# Patient Record
Sex: Female | Born: 1960
Health system: Southern US, Community
[De-identification: ages and names within clinical notes are randomized; demographics above are authoritative.]

## PROBLEM LIST (undated history)

## (undated) DIAGNOSIS — E039 Hypothyroidism, unspecified: Secondary | ICD-10-CM

## (undated) DIAGNOSIS — F419 Anxiety disorder, unspecified: Secondary | ICD-10-CM

## (undated) DIAGNOSIS — Z8659 Personal history of other mental and behavioral disorders: Secondary | ICD-10-CM

## (undated) DIAGNOSIS — I341 Nonrheumatic mitral (valve) prolapse: Secondary | ICD-10-CM

## (undated) DIAGNOSIS — K219 Gastro-esophageal reflux disease without esophagitis: Secondary | ICD-10-CM

## (undated) DIAGNOSIS — R310 Gross hematuria: Secondary | ICD-10-CM

## (undated) DIAGNOSIS — R5383 Other fatigue: Secondary | ICD-10-CM

## (undated) DIAGNOSIS — T7840XA Allergy, unspecified, initial encounter: Secondary | ICD-10-CM

## (undated) DIAGNOSIS — E538 Deficiency of other specified B group vitamins: Secondary | ICD-10-CM

## (undated) DIAGNOSIS — M1990 Unspecified osteoarthritis, unspecified site: Secondary | ICD-10-CM

## (undated) HISTORY — DX: Deficiency of other specified B group vitamins: E53.8

## (undated) HISTORY — DX: Personal history of other mental and behavioral disorders: Z86.59

## (undated) HISTORY — DX: Anxiety disorder, unspecified: F41.9

## (undated) HISTORY — PX: APPENDECTOMY: SHX54

## (undated) HISTORY — PX: BREAST LUMPECTOMY: SHX2

## (undated) HISTORY — DX: Allergy, unspecified, initial encounter: T78.40XA

## (undated) HISTORY — PX: BREAST BIOPSY: SHX20

## (undated) HISTORY — DX: Unspecified osteoarthritis, unspecified site: M19.90

## (undated) HISTORY — PX: ABDOMINAL HYSTERECTOMY: SHX81

## (undated) HISTORY — DX: Other fatigue: R53.83

## (undated) HISTORY — DX: Hypothyroidism, unspecified: E03.9

## (undated) HISTORY — PX: HAND SURGERY: SHX662

## (undated) HISTORY — DX: Gastro-esophageal reflux disease without esophagitis: K21.9

## (undated) HISTORY — DX: Nonrheumatic mitral (valve) prolapse: I34.1

## (undated) HISTORY — DX: Gross hematuria: R31.0

## (undated) HISTORY — PX: POLYPECTOMY: SHX149

---

## 1998-01-01 ENCOUNTER — Inpatient Hospital Stay (HOSPITAL_COMMUNITY): Admission: RE | Admit: 1998-01-01 | Discharge: 1998-01-03 | Payer: Self-pay | Admitting: Gynecology

## 2001-04-26 ENCOUNTER — Other Ambulatory Visit: Admission: RE | Admit: 2001-04-26 | Discharge: 2001-04-26 | Payer: Self-pay | Admitting: Gynecology

## 2001-06-06 ENCOUNTER — Encounter: Admission: RE | Admit: 2001-06-06 | Discharge: 2001-06-06 | Payer: Self-pay | Admitting: Gynecology

## 2001-06-06 ENCOUNTER — Encounter: Payer: Self-pay | Admitting: Gynecology

## 2002-08-28 ENCOUNTER — Other Ambulatory Visit: Admission: RE | Admit: 2002-08-28 | Discharge: 2002-08-28 | Payer: Self-pay | Admitting: Gynecology

## 2003-12-17 ENCOUNTER — Other Ambulatory Visit: Admission: RE | Admit: 2003-12-17 | Discharge: 2003-12-17 | Payer: Self-pay | Admitting: Gynecology

## 2004-01-07 ENCOUNTER — Encounter: Admission: RE | Admit: 2004-01-07 | Discharge: 2004-01-07 | Payer: Self-pay | Admitting: Gynecology

## 2004-01-14 ENCOUNTER — Encounter (INDEPENDENT_AMBULATORY_CARE_PROVIDER_SITE_OTHER): Payer: Self-pay | Admitting: *Deleted

## 2004-01-14 ENCOUNTER — Encounter: Admission: RE | Admit: 2004-01-14 | Discharge: 2004-01-14 | Payer: Self-pay | Admitting: Gynecology

## 2004-12-22 ENCOUNTER — Other Ambulatory Visit: Admission: RE | Admit: 2004-12-22 | Discharge: 2004-12-22 | Payer: Self-pay | Admitting: Gynecology

## 2005-02-26 ENCOUNTER — Encounter: Admission: RE | Admit: 2005-02-26 | Discharge: 2005-02-26 | Payer: Self-pay | Admitting: Gynecology

## 2005-04-30 ENCOUNTER — Ambulatory Visit (HOSPITAL_COMMUNITY): Admission: RE | Admit: 2005-04-30 | Discharge: 2005-04-30 | Payer: Self-pay | Admitting: Surgery

## 2005-04-30 ENCOUNTER — Ambulatory Visit (HOSPITAL_BASED_OUTPATIENT_CLINIC_OR_DEPARTMENT_OTHER): Admission: RE | Admit: 2005-04-30 | Discharge: 2005-04-30 | Payer: Self-pay | Admitting: Surgery

## 2005-04-30 ENCOUNTER — Encounter (INDEPENDENT_AMBULATORY_CARE_PROVIDER_SITE_OTHER): Payer: Self-pay | Admitting: Specialist

## 2005-12-23 ENCOUNTER — Other Ambulatory Visit: Admission: RE | Admit: 2005-12-23 | Discharge: 2005-12-23 | Payer: Self-pay | Admitting: Gynecology

## 2006-12-29 ENCOUNTER — Other Ambulatory Visit: Admission: RE | Admit: 2006-12-29 | Discharge: 2006-12-29 | Payer: Self-pay | Admitting: Gynecology

## 2007-03-21 ENCOUNTER — Encounter: Admission: RE | Admit: 2007-03-21 | Discharge: 2007-03-21 | Payer: Self-pay | Admitting: Gynecology

## 2007-06-28 ENCOUNTER — Ambulatory Visit: Payer: Self-pay | Admitting: Orthopedic Surgery

## 2008-04-09 ENCOUNTER — Encounter: Admission: RE | Admit: 2008-04-09 | Discharge: 2008-04-09 | Payer: Self-pay | Admitting: Gynecology

## 2008-11-24 ENCOUNTER — Emergency Department: Payer: Self-pay | Admitting: Emergency Medicine

## 2008-11-24 IMAGING — CT CT HEAD WITHOUT CONTRAST
2 series · 16 of 30 positions shown, 20 images · non-contrast
Comparison: none

REASON FOR EXAM: headache
COMMENTS:

[Series 2: without · axial · non-contrast · 0.42mm/px · z∈[-635,-515]mm · 13 of 30 slices shown, 17 images]
[im 3/30  brain]
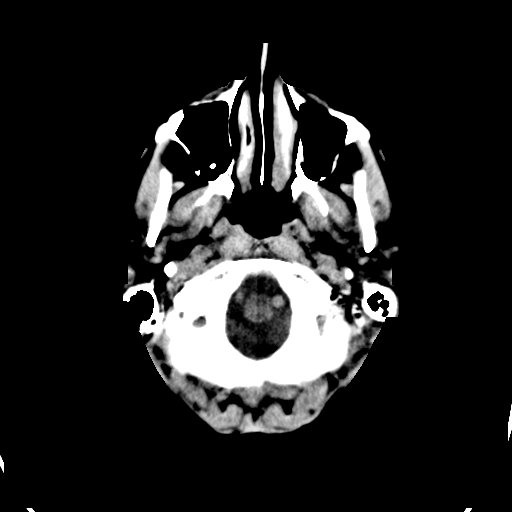
[im 3/30  bone]
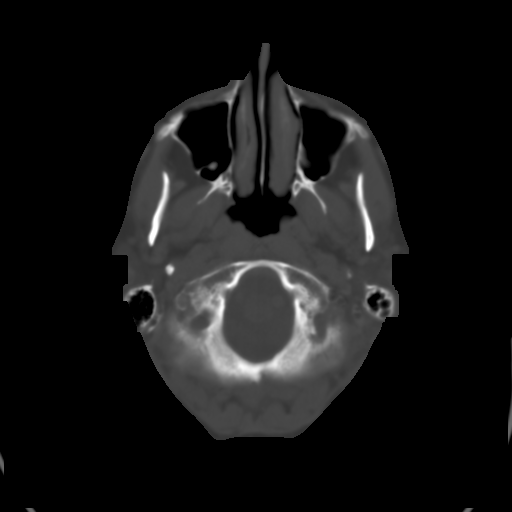
[im 5/30  brain]
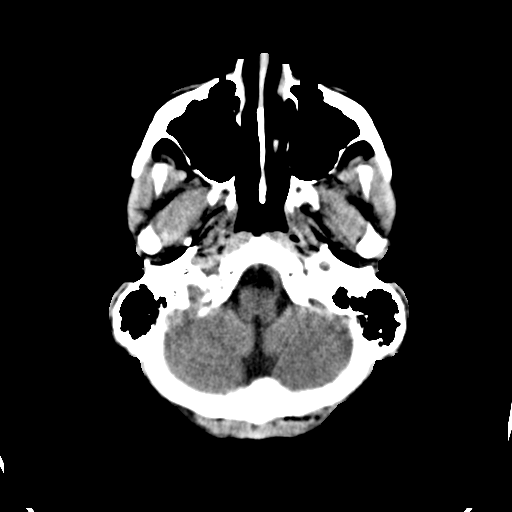
[im 7/30  brain]
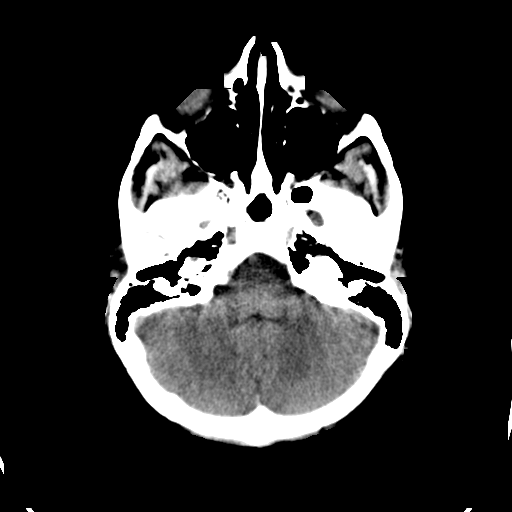
[im 9/30  brain]
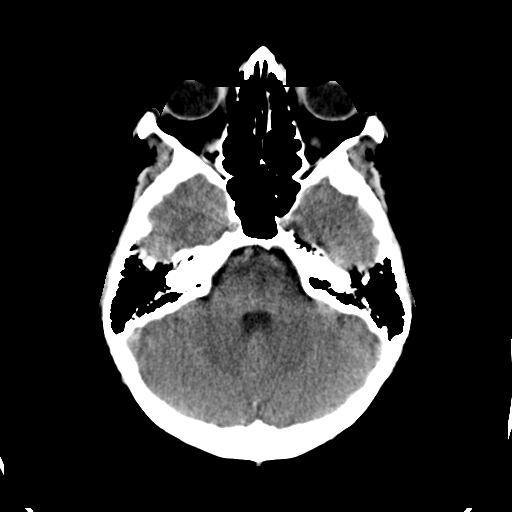
[im 11/30  brain]
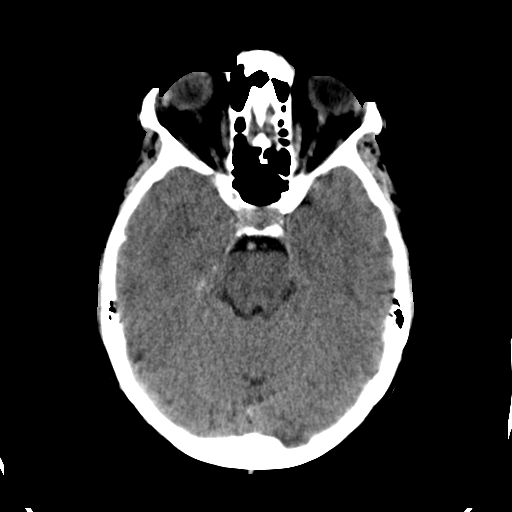
[im 11/30  bone]
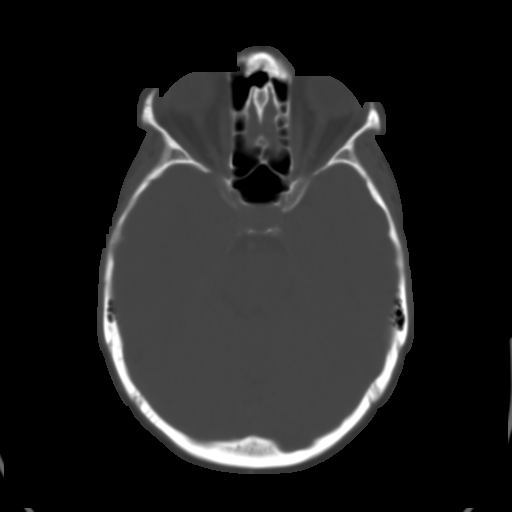
[im 13/30  brain]
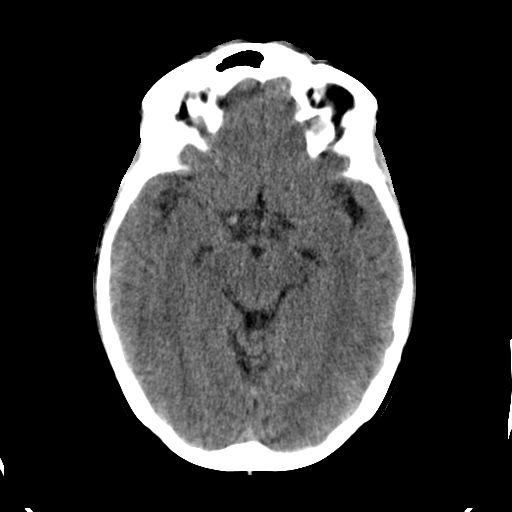
[im 15/30  brain]
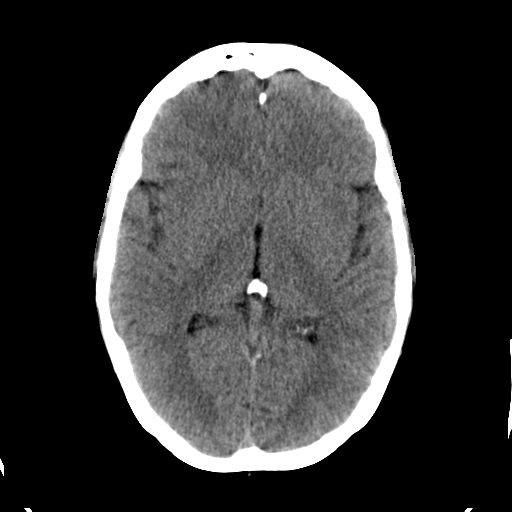
[im 17/30  brain]
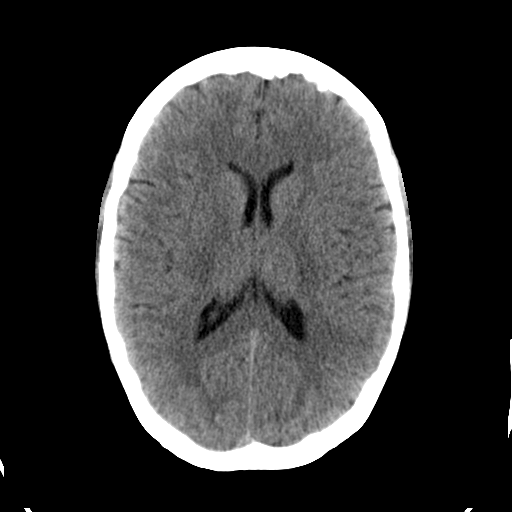
[im 19/30  brain]
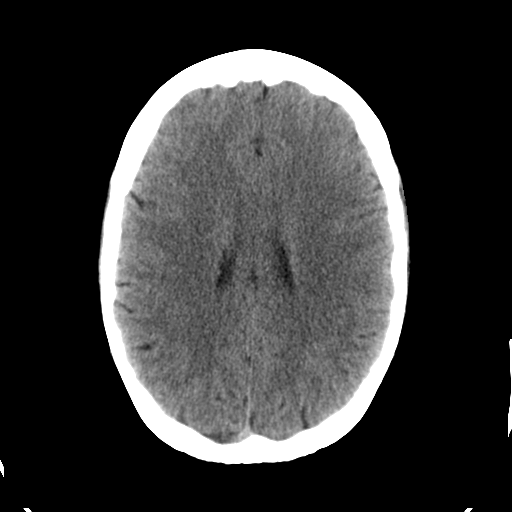
[im 19/30  bone]
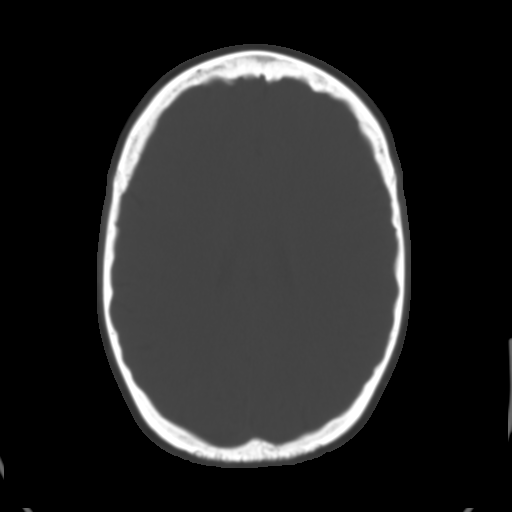
[im 21/30  brain]
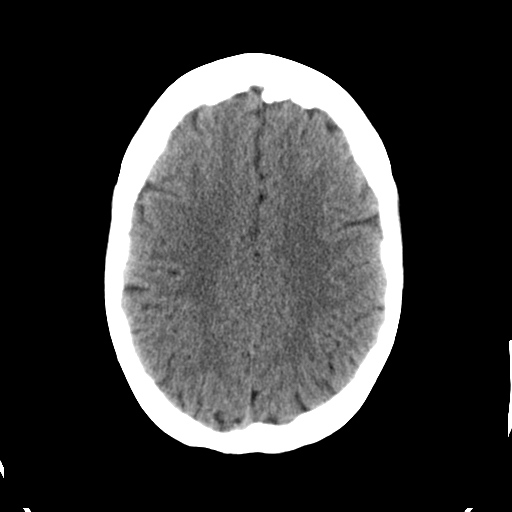
[im 23/30  brain]
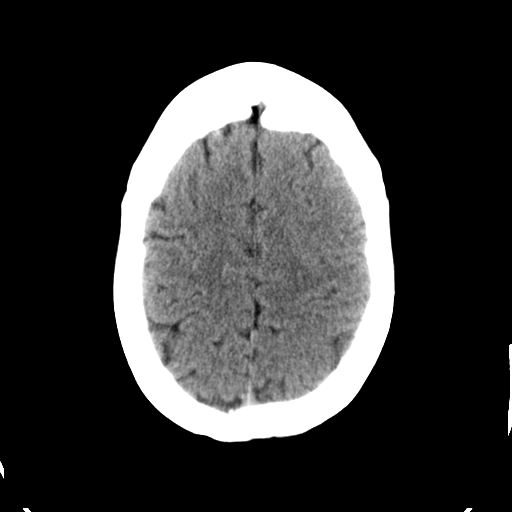
[im 25/30  brain]
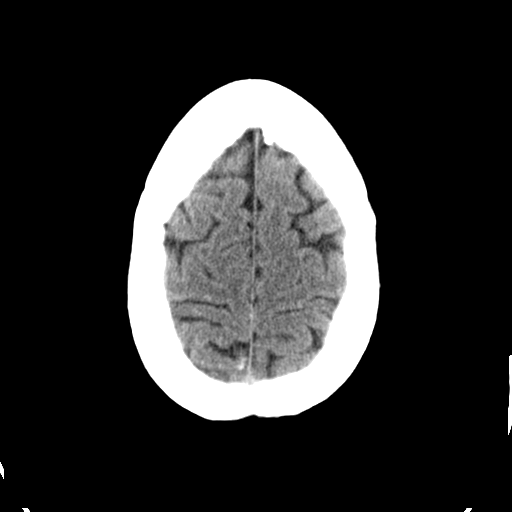
[im 27/30  brain]
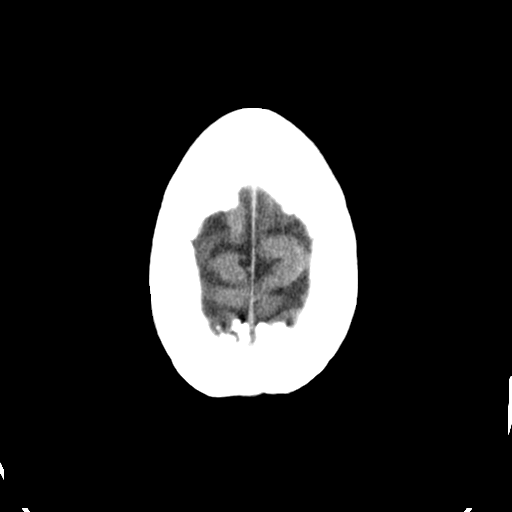
[im 27/30  bone]
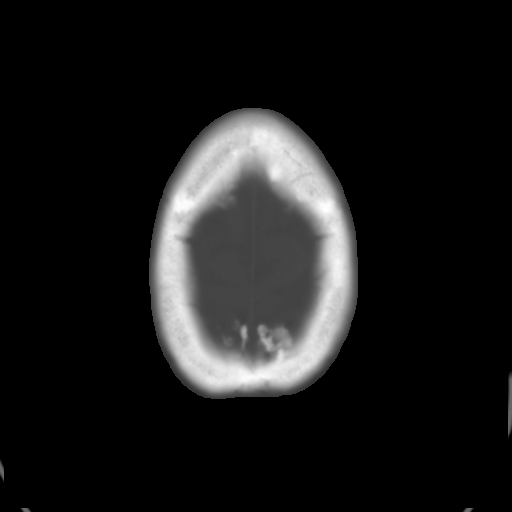

[Series 3: bone · axial · 0.42mm/px · z∈[-635,-595]mm · 3 of 30 slices shown]
[im 3/30  bone]
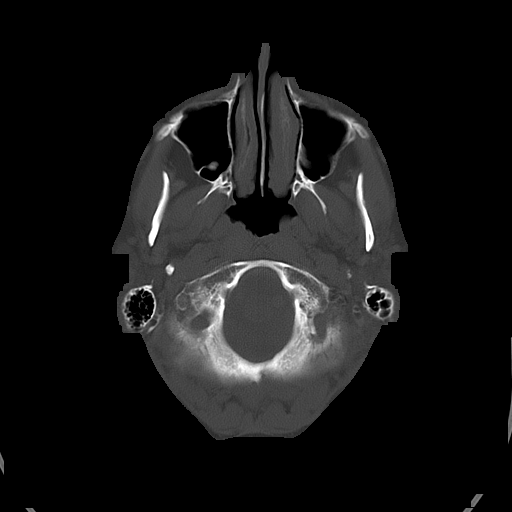
[im 7/30  bone]
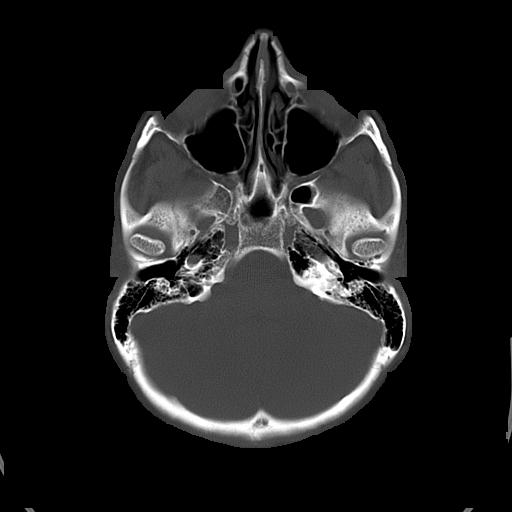
[im 11/30  bone]
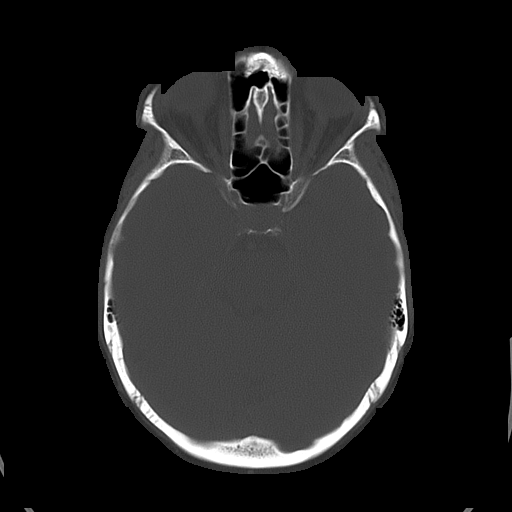

[16 of 30 positions shown; findings below may reference images not displayed]

PROCEDURE:     CT  - CT HEAD WITHOUT CONTRAST  - [DATE] [DATE]

RESULT:     Noncontrast emergent CT of the brain is performed in the
standard fashion. There is no previous exam for comparison.

The ventricles and sulci are normal. There is no hemorrhage. There is no
focal mass, mass-effect or midline shift. There is no evidence of edema or
territorial infarct. The bone windows demonstrate normal aeration of the
paranasal sinuses and mastoid air cells. There is no skull fracture
demonstrated.
IMPRESSION: 1. No acute intracranial abnormality.

## 2009-04-15 ENCOUNTER — Encounter: Admission: RE | Admit: 2009-04-15 | Discharge: 2009-04-15 | Payer: Self-pay | Admitting: Gynecology

## 2009-04-22 ENCOUNTER — Encounter: Admission: RE | Admit: 2009-04-22 | Discharge: 2009-04-22 | Payer: Self-pay | Admitting: Gynecology

## 2010-05-05 ENCOUNTER — Encounter: Admission: RE | Admit: 2010-05-05 | Discharge: 2010-05-05 | Payer: Self-pay | Admitting: Gynecology

## 2010-05-15 ENCOUNTER — Encounter
Admission: RE | Admit: 2010-05-15 | Discharge: 2010-05-15 | Payer: Self-pay | Source: Home / Self Care | Attending: Gynecology | Admitting: Gynecology

## 2010-10-17 NOTE — Op Note (Signed)
NAMEANGELLI, Vickie Drake              ACCOUNT NO.:  [id number]   MEDICAL RECORD NO.:  [id number]          PATIENT TYPE:  AMB   LOCATION:  [facility]                          FACILITY:  [facility]   PHYSICIAN:  Currie Paris, M.D.DATE OF BIRTH:  July 07, 1960   DATE OF PROCEDURE:  04/30/2005  DATE OF DISCHARGE:                                 OPERATIVE REPORT   OFFICE MEDICAL RECORD NUMBER:  [id number].   PREOPERATIVE DIAGNOSIS:  Left breast mass, probable fibroadenoma.   POSTOPERATIVE DIAGNOSIS:  Left breast mass, probable fibroadenoma.   OPERATION:  Excision left breast mass.   SURGEON:  Currie Paris, M.D.   ANESTHESIA:  MAC converted to general.   CLINICAL HISTORY:  This is a 44-year lady with a left breast mass that was  clinically a fibroadenoma.  It had recently gotten much larger and  excisional biopsy was recommended.   DESCRIPTION OF PROCEDURE:  The patient was seen in the holding area and she  had no further questions. The left breast was identified and marked by the  patient and myself as the operative site.   The patient taken to the operating room and after satisfactory IV sedation,  left breast was prepped and draped. A time-out occurred.   The mass was readily palpable in the mid lateral position. I made an  incision directly over this, divided a little of the subcutaneous tissues.  However, the patient, despite appearing to be asleep, was moving with her  hands and anesthesia elected to place an LMA. Once that was done, we  proceeded with the procedure.   I put a 3-0 Vicryl suture through the mass leaving a little bit of normal  breast tissue overlying it. With that, I was able to use traction and  cutting current and coagulation with electrocautery to excise the mass with  what appeared be rim of normal tissue around it. There was significant  fibrocystic disease around this area as well.   Once the mass was out, we infiltrated additional local. We used a  combination 1% Xylocaine plain and 0.5% Marcaine plain mixed equally.   Once that had been done and everything appeared to be dry, I closed the  breast in layers with 3-0 Vicryl followed by 4-0 Monocryl subcuticular plus  Dermabond.   The patient tolerated the procedure well. There were no operative  complications. All counts correct.      Currie Paris, M.D.  Electronically Signed     CJS/MEDQ  D:  04/30/2005  T:  04/30/2005  Job:  [id number]   cc:   Tama Gander, M.D.   Gretta Cool, M.D.  Fax: [phone]

## 2011-01-12 LAB — HM COLONOSCOPY

## 2011-01-26 ENCOUNTER — Other Ambulatory Visit: Payer: Self-pay | Admitting: Gynecology

## 2011-02-13 ENCOUNTER — Ambulatory Visit: Payer: Self-pay | Admitting: Nephrology

## 2011-02-13 IMAGING — CR DG ABDOMEN 1V
1 series · 2 of 2 positions shown · non-contrast
Comparison: none

REASON FOR EXAM: FLANK PAIN GROSS HEMATURIA CALL REPORT [PHONE_NUMBER]
COMMENTS:

[Series 1: view not recorded · 0.17mm/px · 2 of 2 slices shown]
[im 1/2]
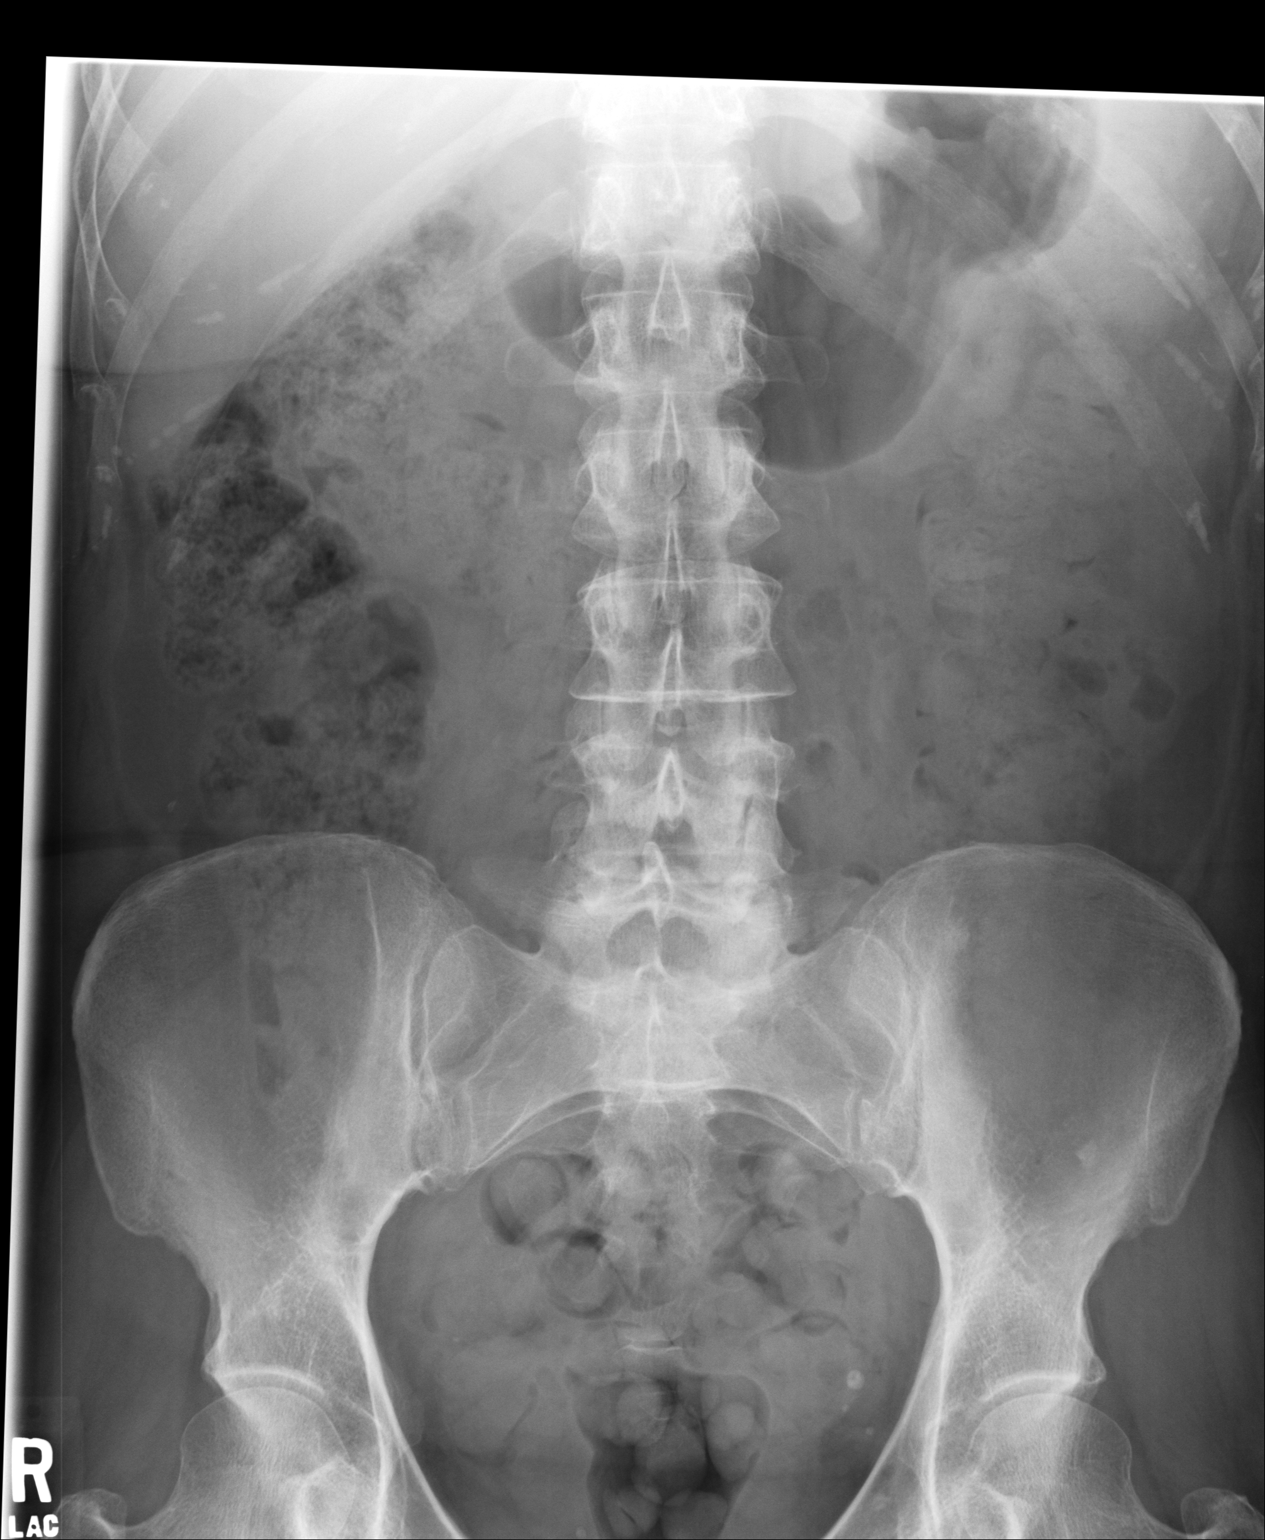
[im 2/2]
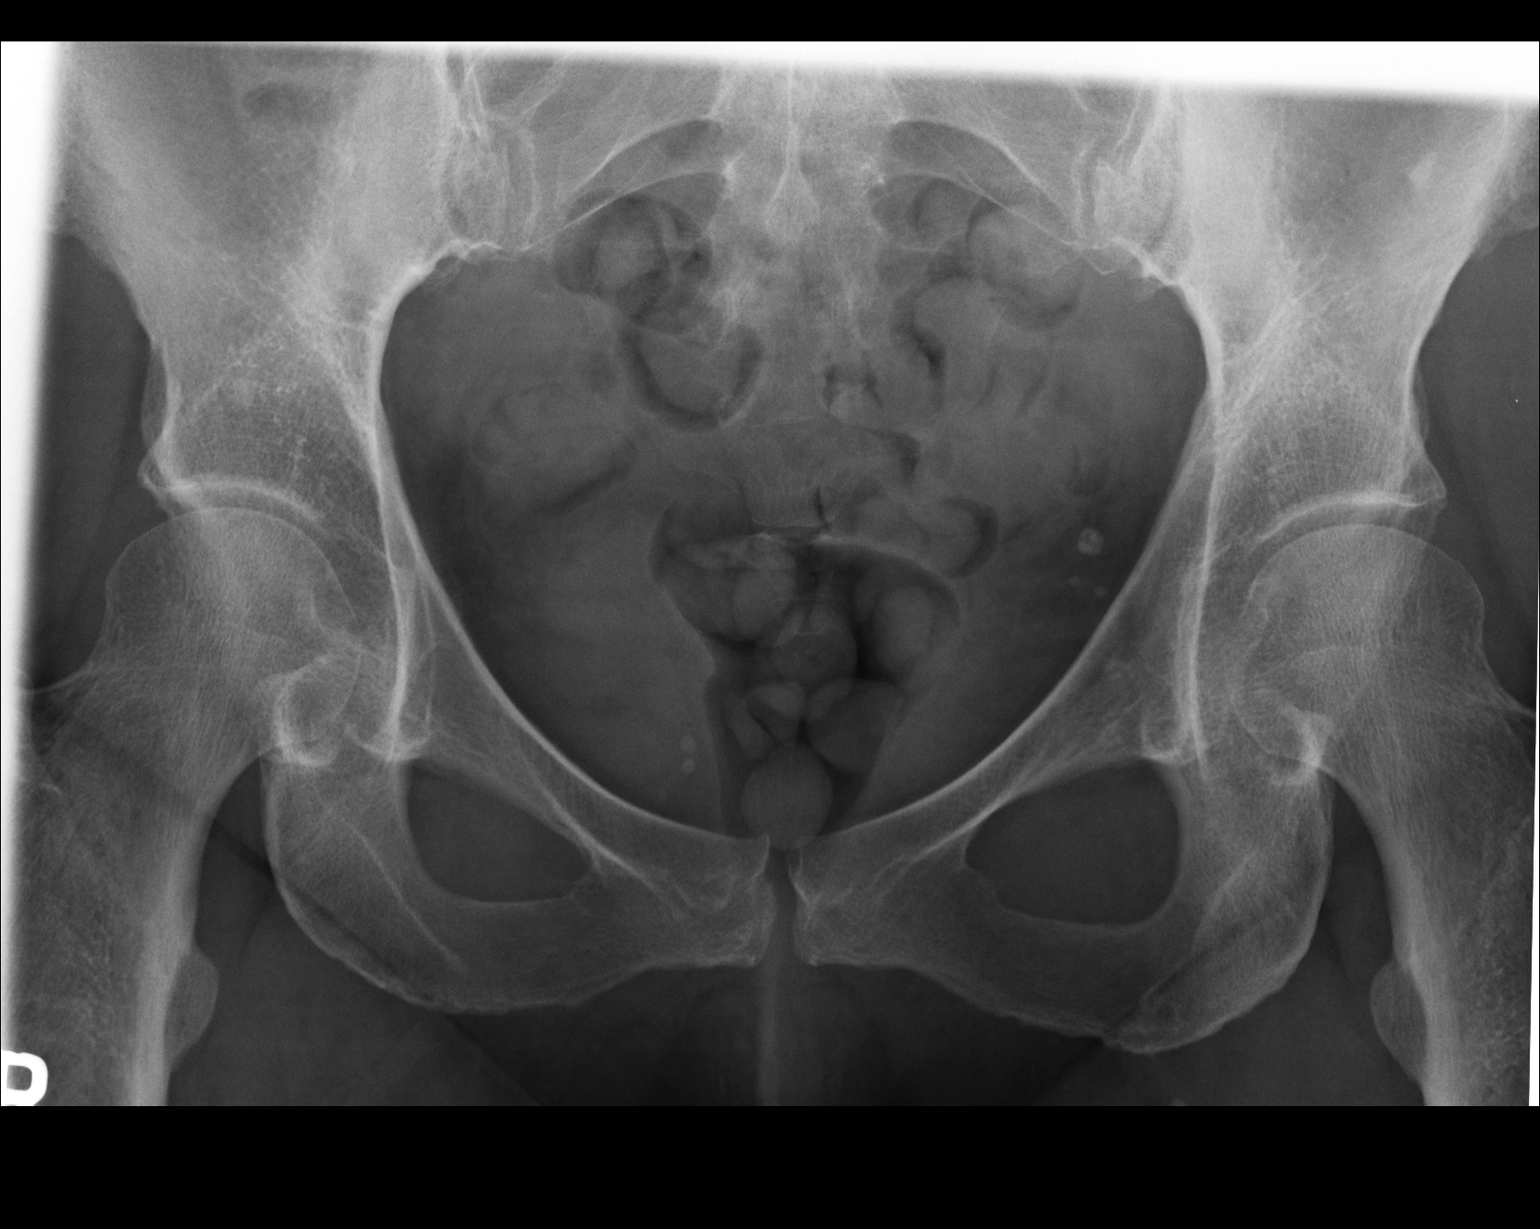

[2 of 2 positions shown; findings below may reference images not displayed]

PROCEDURE:     DXR - DXR KIDNEY URETER BLADDER  - [DATE]  [DATE]

RESULT:     There is a large amount of fecal material in the colon. No bowel
obstruction is seen. No abnormal intra-abdominal calcifications are
identified. Specifically no renal stones are seen but renal stones could be
present and obscured by overlying bowel content. The osseous structures show
no significant abnormalities.
IMPRESSION: 1. No definitely abnormal intra-abdominal calcifications are seen.
2. There is a large amount of fecal material in the colon.

## 2011-02-17 ENCOUNTER — Ambulatory Visit: Payer: Self-pay | Admitting: Nephrology

## 2011-02-17 IMAGING — CT CT STONE STUDY
1 of 2 series · 16 of 32 positions shown, 20 images · non-contrast
Comparison: none

REASON FOR EXAM: right flank pain gross hematuria
COMMENTS:

PROCEDURE:     KCT - KCT ABDOMEN/PELVIS WO (STONE)  - [DATE]  [DATE]
RESULT:
HISTORY: Right flank pain. Hematuria.
Comparison Study: No prior.

[Series 2: abd 3mm wo 3.0 i40f 3 · axial · 0.91mm/px · z∈[-522,-82]mm · 16 of 161 slices shown, 20 images]
[im 7/161  soft-tissue]
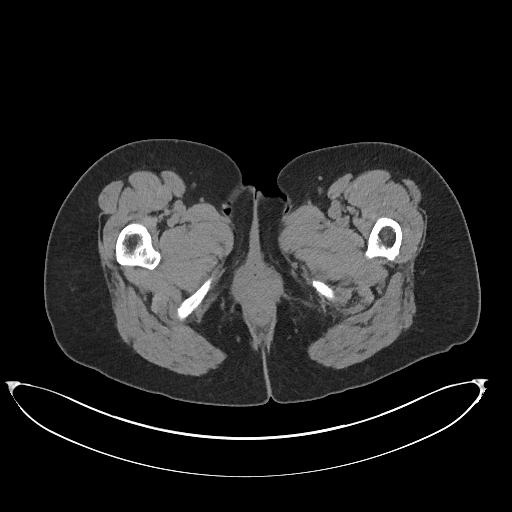
[im 7/161  bone]
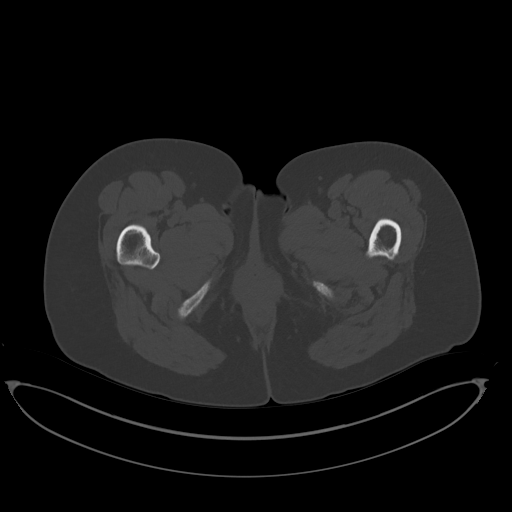
[im 19/161  soft-tissue]
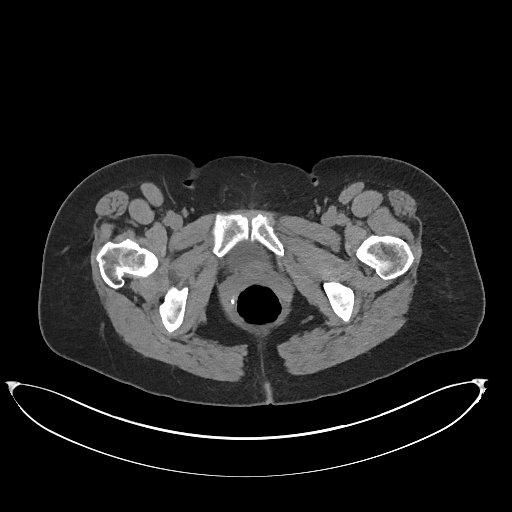
[im 31/161  soft-tissue]
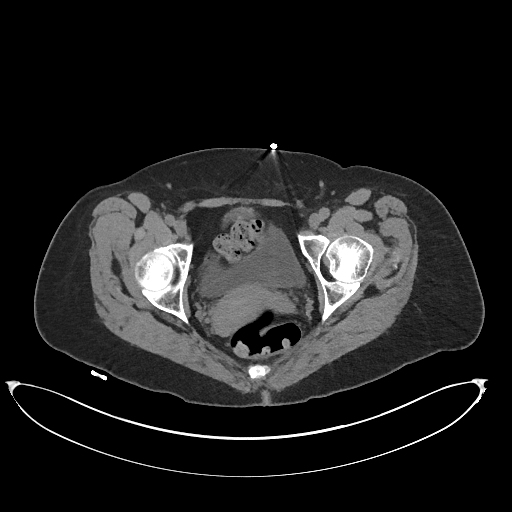
[im 44/161  soft-tissue]
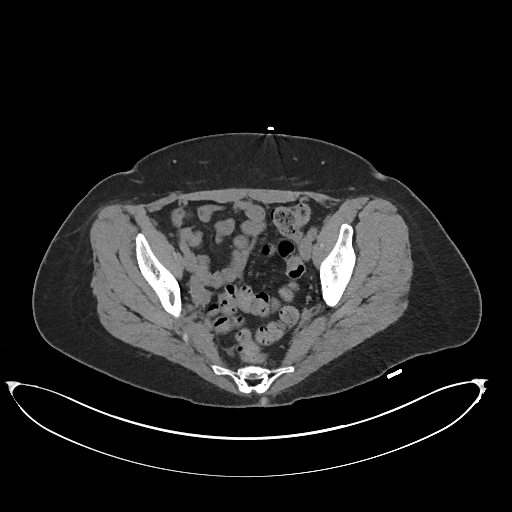
[im 56/161  soft-tissue]
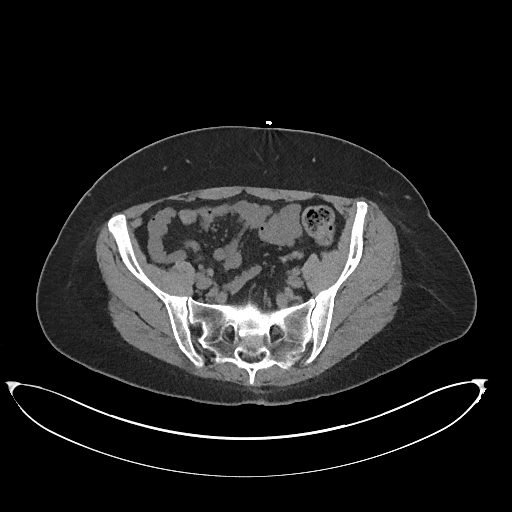
[im 62/161  soft-tissue]
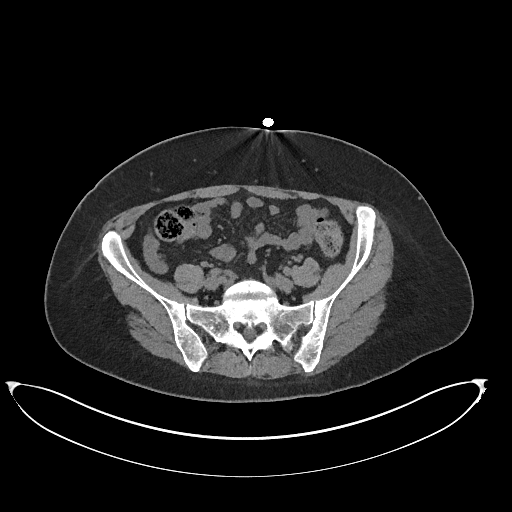
[im 74/161  soft-tissue]
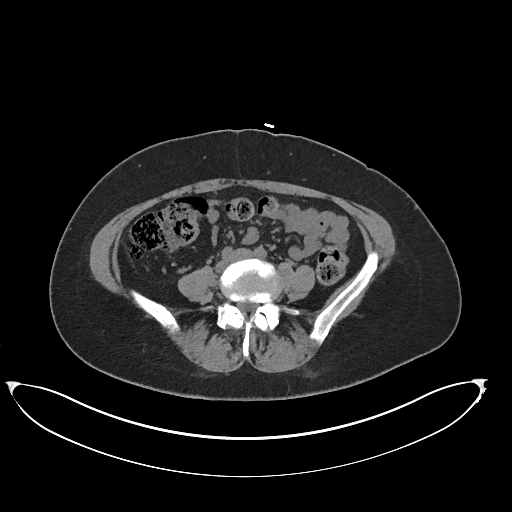
[im 87/161  soft-tissue]
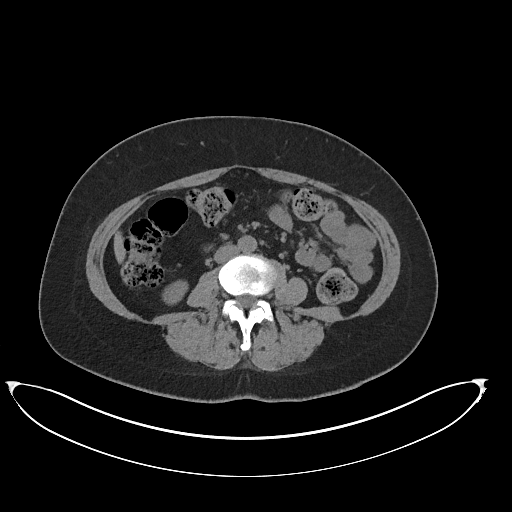
[im 99/161  soft-tissue]
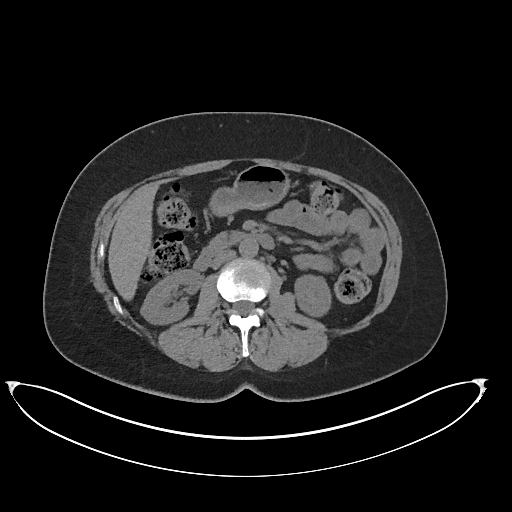
[im 99/161  bone]
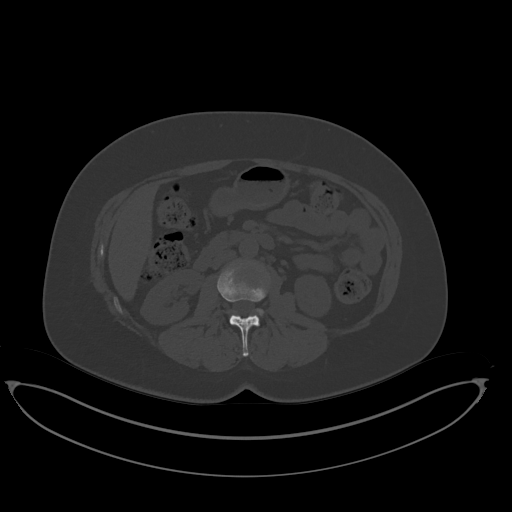
[im 105/161  soft-tissue]
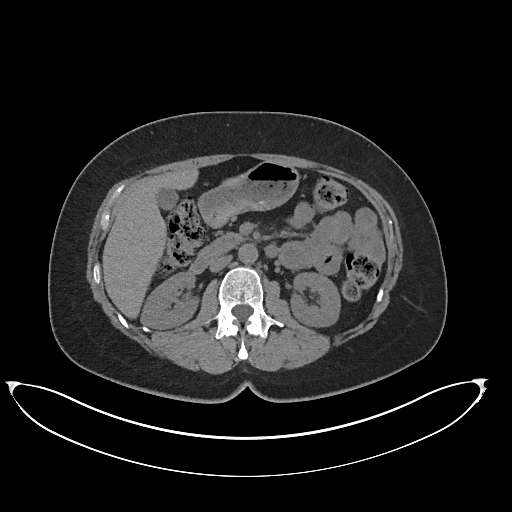
[im 117/161  soft-tissue]
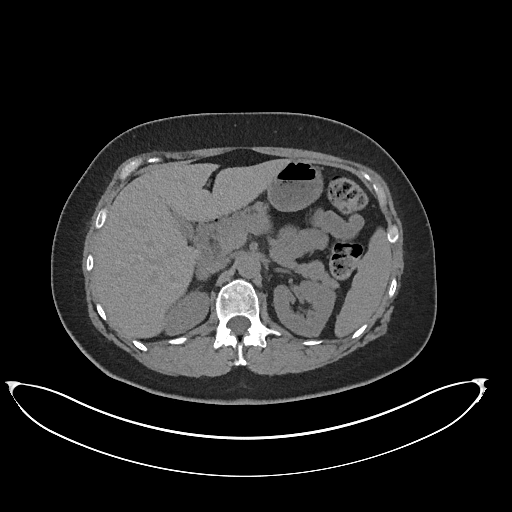
[im 130/161  soft-tissue]
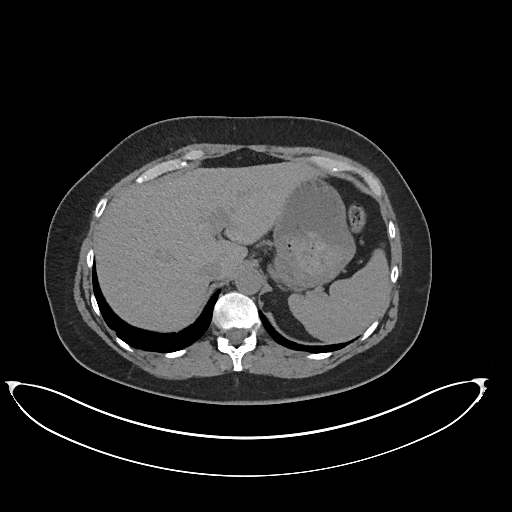
[im 136/161  lung]
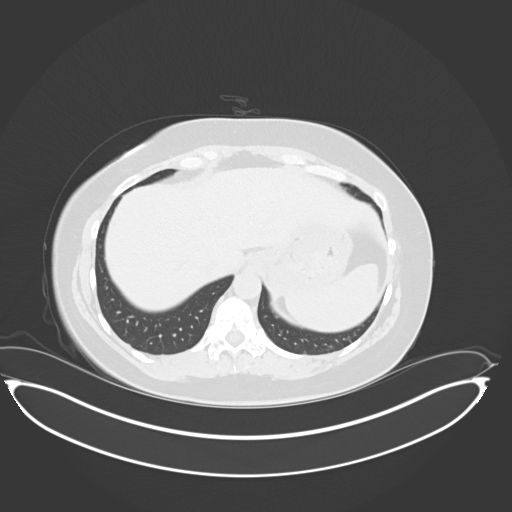
[im 142/161  soft-tissue]
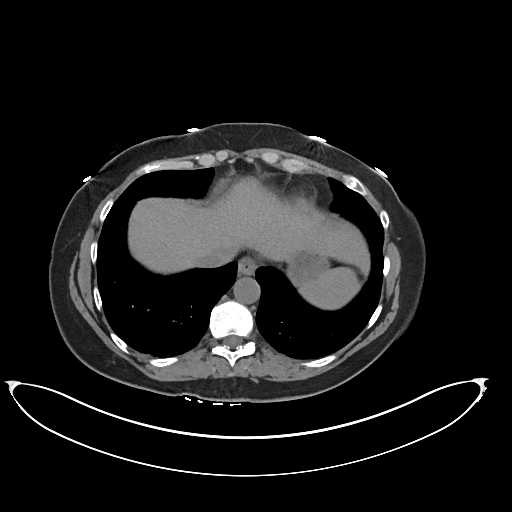
[im 142/161  lung]
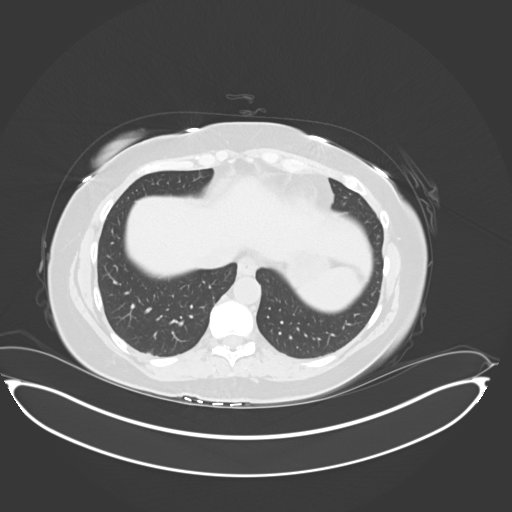
[im 148/161  lung]
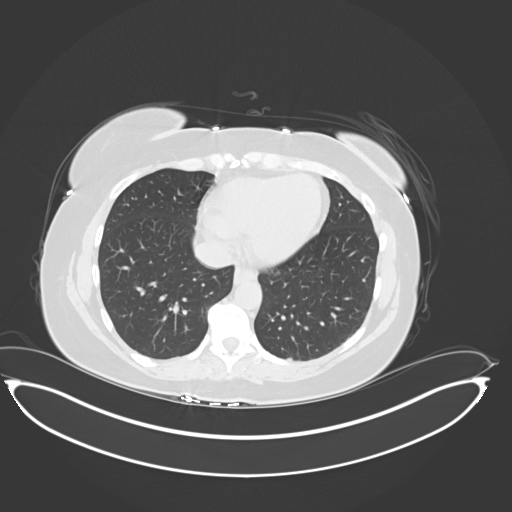
[im 154/161  soft-tissue]
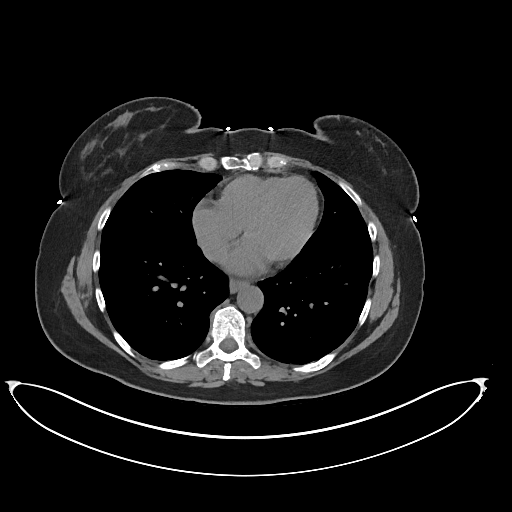
[im 154/161  lung]
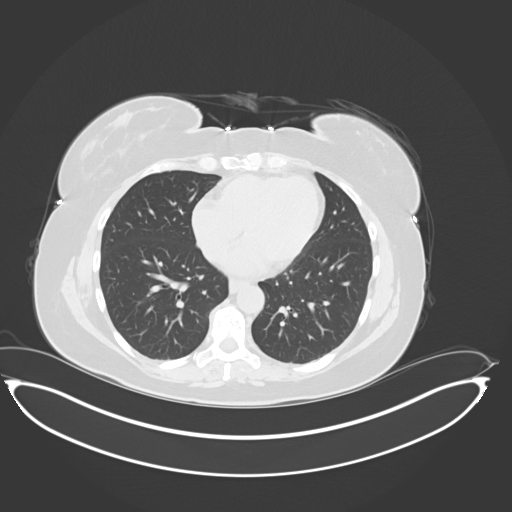

[16 of 32 positions shown; findings below may reference images not displayed]

FINDINGS: Standard CT was obtained. No contrast was administered. The liver
is normal. The spleen is normal. The pancreas is normal. The adrenals are
normal. The kidneys are normal. No hydronephrosis. Right and left lower
quadrants demonstrate no inflammatory change. The bladder is nondistended. A
right ovarian cyst is noted.
IMPRESSION: 1. Small right ovarian cyst. No acute abnormality. No free pelvic fluid.

## 2012-01-27 ENCOUNTER — Other Ambulatory Visit: Payer: Self-pay | Admitting: Gynecology

## 2012-03-03 ENCOUNTER — Other Ambulatory Visit: Payer: Self-pay | Admitting: Gynecology

## 2012-03-03 DIAGNOSIS — Z803 Family history of malignant neoplasm of breast: Secondary | ICD-10-CM

## 2012-03-03 DIAGNOSIS — Z1231 Encounter for screening mammogram for malignant neoplasm of breast: Secondary | ICD-10-CM

## 2012-03-17 ENCOUNTER — Ambulatory Visit
Admission: RE | Admit: 2012-03-17 | Discharge: 2012-03-17 | Disposition: A | Discharge status: Home / Self Care | Payer: Self-pay | Source: Ambulatory Visit | Attending: Gynecology | Admitting: Gynecology

## 2012-03-17 DIAGNOSIS — Z1231 Encounter for screening mammogram for malignant neoplasm of breast: Secondary | ICD-10-CM

## 2012-03-17 DIAGNOSIS — Z803 Family history of malignant neoplasm of breast: Secondary | ICD-10-CM

## 2012-03-22 ENCOUNTER — Other Ambulatory Visit: Payer: Self-pay | Admitting: Gynecology

## 2012-03-22 DIAGNOSIS — R928 Other abnormal and inconclusive findings on diagnostic imaging of breast: Secondary | ICD-10-CM

## 2012-04-08 ENCOUNTER — Ambulatory Visit
Admission: RE | Admit: 2012-04-08 | Discharge: 2012-04-08 | Disposition: A | Discharge status: Home / Self Care | Payer: BC Managed Care – PPO | Source: Ambulatory Visit | Attending: Gynecology | Admitting: Gynecology

## 2012-04-08 DIAGNOSIS — R928 Other abnormal and inconclusive findings on diagnostic imaging of breast: Secondary | ICD-10-CM

## 2013-06-01 DIAGNOSIS — R310 Gross hematuria: Secondary | ICD-10-CM

## 2013-06-01 HISTORY — DX: Gross hematuria: R31.0

## 2013-08-28 DIAGNOSIS — Z87891 Personal history of nicotine dependence: Secondary | ICD-10-CM | POA: Insufficient documentation

## 2013-08-28 DIAGNOSIS — R079 Chest pain, unspecified: Secondary | ICD-10-CM | POA: Insufficient documentation

## 2014-01-15 DIAGNOSIS — E0590 Thyrotoxicosis, unspecified without thyrotoxic crisis or storm: Secondary | ICD-10-CM | POA: Insufficient documentation

## 2014-03-05 ENCOUNTER — Other Ambulatory Visit: Payer: Self-pay | Admitting: Obstetrics and Gynecology

## 2014-03-05 DIAGNOSIS — R928 Other abnormal and inconclusive findings on diagnostic imaging of breast: Secondary | ICD-10-CM

## 2014-03-08 ENCOUNTER — Encounter (INDEPENDENT_AMBULATORY_CARE_PROVIDER_SITE_OTHER): Payer: Self-pay

## 2014-03-08 ENCOUNTER — Ambulatory Visit
Admission: RE | Admit: 2014-03-08 | Discharge: 2014-03-08 | Disposition: A | Discharge status: Home / Self Care | Payer: BC Managed Care – PPO | Source: Ambulatory Visit | Attending: Obstetrics and Gynecology | Admitting: Obstetrics and Gynecology

## 2014-03-08 DIAGNOSIS — R928 Other abnormal and inconclusive findings on diagnostic imaging of breast: Secondary | ICD-10-CM

## 2014-08-06 DIAGNOSIS — R002 Palpitations: Secondary | ICD-10-CM | POA: Insufficient documentation

## 2014-08-13 ENCOUNTER — Other Ambulatory Visit: Payer: Self-pay | Admitting: Gynecology

## 2014-08-13 DIAGNOSIS — R921 Mammographic calcification found on diagnostic imaging of breast: Secondary | ICD-10-CM

## 2014-09-17 ENCOUNTER — Ambulatory Visit
Admission: RE | Admit: 2014-09-17 | Discharge: 2014-09-17 | Disposition: A | Discharge status: Home / Self Care | Payer: BLUE CROSS/BLUE SHIELD | Source: Ambulatory Visit | Attending: Gynecology | Admitting: Gynecology

## 2014-09-17 DIAGNOSIS — R921 Mammographic calcification found on diagnostic imaging of breast: Secondary | ICD-10-CM

## 2015-01-29 DIAGNOSIS — E039 Hypothyroidism, unspecified: Secondary | ICD-10-CM | POA: Insufficient documentation

## 2015-01-29 DIAGNOSIS — E538 Deficiency of other specified B group vitamins: Secondary | ICD-10-CM | POA: Insufficient documentation

## 2015-01-29 DIAGNOSIS — R5383 Other fatigue: Secondary | ICD-10-CM | POA: Insufficient documentation

## 2015-01-29 DIAGNOSIS — R310 Gross hematuria: Secondary | ICD-10-CM | POA: Insufficient documentation

## 2015-02-01 ENCOUNTER — Ambulatory Visit: Payer: Self-pay | Admitting: Family Medicine

## 2015-02-06 ENCOUNTER — Encounter: Payer: Self-pay | Admitting: Family Medicine

## 2015-02-06 ENCOUNTER — Ambulatory Visit (INDEPENDENT_AMBULATORY_CARE_PROVIDER_SITE_OTHER): Payer: BLUE CROSS/BLUE SHIELD | Admitting: Family Medicine

## 2015-02-06 VITALS — BP 110/73 | HR 67 | Temp 97.1°F | Ht 66.5 in | Wt 147.0 lb

## 2015-02-06 DIAGNOSIS — M25512 Pain in left shoulder: Secondary | ICD-10-CM | POA: Diagnosis not present

## 2015-02-06 DIAGNOSIS — E038 Other specified hypothyroidism: Secondary | ICD-10-CM

## 2015-02-06 DIAGNOSIS — M25559 Pain in unspecified hip: Secondary | ICD-10-CM | POA: Insufficient documentation

## 2015-02-06 DIAGNOSIS — R739 Hyperglycemia, unspecified: Secondary | ICD-10-CM

## 2015-02-06 DIAGNOSIS — Z87898 Personal history of other specified conditions: Secondary | ICD-10-CM | POA: Insufficient documentation

## 2015-02-06 DIAGNOSIS — Z862 Personal history of diseases of the blood and blood-forming organs and certain disorders involving the immune mechanism: Secondary | ICD-10-CM | POA: Diagnosis not present

## 2015-02-06 DIAGNOSIS — M25552 Pain in left hip: Secondary | ICD-10-CM | POA: Diagnosis not present

## 2015-02-06 DIAGNOSIS — R3129 Other microscopic hematuria: Secondary | ICD-10-CM | POA: Insufficient documentation

## 2015-02-06 DIAGNOSIS — R310 Gross hematuria: Secondary | ICD-10-CM

## 2015-02-06 DIAGNOSIS — R5383 Other fatigue: Secondary | ICD-10-CM | POA: Diagnosis not present

## 2015-02-06 DIAGNOSIS — R312 Other microscopic hematuria: Secondary | ICD-10-CM

## 2015-02-06 DIAGNOSIS — M25519 Pain in unspecified shoulder: Secondary | ICD-10-CM | POA: Insufficient documentation

## 2015-02-06 DIAGNOSIS — E538 Deficiency of other specified B group vitamins: Secondary | ICD-10-CM

## 2015-02-06 DIAGNOSIS — E559 Vitamin D deficiency, unspecified: Secondary | ICD-10-CM | POA: Insufficient documentation

## 2015-02-06 DIAGNOSIS — Z0000 Encounter for general adult medical examination without abnormal findings: Secondary | ICD-10-CM

## 2015-02-06 LAB — UA/M W/RFLX CULTURE, ROUTINE
Bilirubin, UA: NEGATIVE
GLUCOSE, UA: NEGATIVE
Ketones, UA: NEGATIVE
Leukocytes, UA: NEGATIVE
Nitrite, UA: NEGATIVE
PH UA: 6 (ref 5.0–7.5)
PROTEIN UA: NEGATIVE
RBC, UA: NEGATIVE
Specific Gravity, UA: 1.005 (ref 1.005–1.030)
Urobilinogen, Ur: 0.2 mg/dL (ref 0.2–1.0)

## 2015-02-06 NOTE — Assessment & Plan Note (Signed)
Check urine today; she never had cystoscopy, but this was encouraged; discussed risk of bladder cancer

## 2015-02-06 NOTE — Assessment & Plan Note (Signed)
Check TSH today

## 2015-02-06 NOTE — Assessment & Plan Note (Addendum)
Check ferritin; pt reports hx of low iron

## 2015-02-06 NOTE — Patient Instructions (Addendum)
Ice topically 15-20 minutes at a time, 3 times a day; always keep cloth or some material between ice and skin We'll refer you to physical therapy Avoid pressure as much as possible on your sacral area until your symptoms have resolved We'll let you know about the lab results If you have not heard anything from my staff in a week about any orders/referrals/studies from today, please contact us here to follow-up [phone] Please do reconsider finishing the hematuria work-up to evaluate the cause of the blood in your urine to make sure there is no cancer; most patients do NOT have cancer, but we don't know for sure until the work-up is complete Flu shots every fall

## 2015-02-06 NOTE — Assessment & Plan Note (Signed)
Check cholesterol; physical was NOT done today and she can return for later visit

## 2015-02-06 NOTE — Assessment & Plan Note (Signed)
Intermittent; last urine normal per patient; will check urine today; she is reluctant to have cystoscopy done but it was encouraged

## 2015-02-06 NOTE — Assessment & Plan Note (Addendum)
Check A1C and glucose today (glucose is not fasting, though)

## 2015-02-06 NOTE — Progress Notes (Signed)
BP 110/73 mmHg  Pulse 67  Temp(Src) 97.1 F (36.2 C)  Ht 5' 6.5" (1.689 m)  Wt 147 lb (66.679 kg)  BMI 23.37 kg/m2  SpO2 99%   Subjective:    Patient ID: Vickie Drake, female    DOB: [birth year], 54 y.o.   MRN: [id number]  HPI: Vickie Drake is a 54 y.o. female  Chief Complaint  Patient presents with  . Hypothyroidism    She never started the Synthroid and never had it rechecked.  . Leg Pain    left leg x 3 weeks, but is improving.   She was started on thyroid medicine; went and got it and it's in the pantry but she never  She feels the same Does have fluttering of the heart and she has been seeing Dr. Saralyn Pilar, cardiologist; they did echo and it was fine; he said people's hearts will flutter, nothing to worry about; maybe a little more noticeable with caffeine, just picks times; laying down; no chest pain; she is going to tell him that she has felt some discomfort on the left arm, runs up the left shoulder and into the neck, moves and gets situated and it will go away; she does ride horses and is pretty rough; presses and it's sore to the touch sometimes over the left deltoid; hurts a little to fasten bra and to take a shirt off; hot bath helps a little, I don't know; it doesn't hurt unless she lays on it wrong in the bed; going on for 2 months; right-handed She was actually coming in today about her leg; she had the horse falling on her on the left leg; bottom of buttock; right at the bottom of the buttock and around the back of the upper thigh and around to the anteromedial thigh; now it's better than last week; hot baths; when she sits on something hard like a stool, she sits on stool at vanity, more than 10 minutes, very sore and hurts so bad; no blisters or skin changes; no swelling in the leg; no change in the bowel or bladder She never had the cystoscopy, but had everything else done with urologist for hematuria CT scan done; just did not have cystoscopy  Relevant past  medical, surgical, family and social history reviewed and updated as indicated. Interim medical history since our last visit reviewed. Allergies and medications reviewed and updated.  Review of Systems  Constitutional: Negative for unexpected weight change (stable for two years).  HENT: Positive for hearing loss (not new).   Eyes: Positive for visual disturbance (blurred vision at times; she is past due for eye exam).  Gastrointestinal: Negative for blood in stool.  Endocrine: Negative for cold intolerance, heat intolerance and polydipsia.  Genitourinary: Negative for hematuria (has hx of blood in the urine intermittently; checked out by urologist, Dr. Eliberto Ivory).  Hematological: Negative for adenopathy. Bruises/bleeds easily: not new.   Per HPI unless specifically indicated above     Objective:    BP 110/73 mmHg  Pulse 67  Temp(Src) 97.1 F (36.2 C)  Ht 5' 6.5" (1.689 m)  Wt 147 lb (66.679 kg)  BMI 23.37 kg/m2  SpO2 99%  Wt Readings from Last 3 Encounters:  02/06/15 147 lb (66.679 kg)  01/29/14 141 lb (63.957 kg)    Physical Exam  Constitutional: She appears well-developed and well-nourished. No distress.  HENT:  Head: Normocephalic and atraumatic.  Eyes: EOM are normal. No scleral icterus.  Neck: No thyromegaly present.  Cardiovascular: Normal  rate and normal heart sounds.   No murmur heard. Pulmonary/Chest: Effort normal. No respiratory distress. She has no wheezes.  Abdominal: Soft. Bowel sounds are normal. She exhibits no distension.  Musculoskeletal: Normal range of motion. She exhibits no edema.       Left shoulder: She exhibits tenderness. She exhibits normal range of motion, no swelling, no crepitus, no deformity and normal strength.  Tender to palpation over the left ischial tuberosity; no masses felt; pressure in that area reproduces her symptoms; pain with reaaching behind her back; tender to palpation over lateral left deltoid  Neurological: She is alert. She  displays no tremor. She exhibits normal muscle tone. Gait normal.  Skin: Skin is warm and dry. No rash noted. She is not diaphoretic. No pallor.  Psychiatric: She has a normal mood and affect. Her behavior is normal. Judgment and thought content normal.    Results for orders placed or performed in visit on 01/29/15  HM COLONOSCOPY  Result Value Ref Range   HM Colonoscopy per PP       Assessment & Plan:   Problem List Items Addressed This Visit      Digestive   Vitamin B12 deficiency    Check B12 level today      Relevant Orders   Vitamin B12     Endocrine   Hypothyroidism - Primary    Check TSH today      Relevant Orders   TSH     Genitourinary   Gross hematuria    Intermittent; last urine normal per patient; will check urine today; she is reluctant to have cystoscopy done but it was encouraged      Relevant Orders   UA/M w/rflx Culture, Routine   Hematuria, microscopic    Check urine today; she never had cystoscopy, but this was encouraged; discussed risk of bladder cancer      Relevant Orders   UA/M w/rflx Culture, Routine     Other   Fatigue    Check ferritin; pt reports hx of low iron      Borderline hyperglycemia    Check A1C and glucose today (glucose is not fasting, though)      Relevant Orders   Hgb A1c w/o eAG   Vitamin D deficiency   Relevant Orders   Vit D  25 hydroxy (rtn osteoporosis monitoring)   History of anemia   Relevant Orders   Ferritin   Encounter for preventive health examination    Check cholesterol; physical was NOT done today and she can return for later visit      Relevant Orders   CBC with Differential/Platelet   Comprehensive metabolic panel   Left shoulder pain    Suspect tendonitis, but cannot r/o rotator cuff partial tear; will start with PT and then MRI if not improving      Relevant Orders   Ambulatory referral to Physical Therapy   Left-sided ischial pain    Pain occurs after sitting on hard stool, also  horseback riding; no signs of herpes or shingles; will have her alleviate pressure on that area          Follow up plan: Return if symptoms worsen or fail to improve.  An after-visit summary was printed and given to the patient at [place].  Please see the patient instructions which may contain other information and recommendations beyond what is mentioned above in the assessment and plan. Orders Placed This Encounter  Procedures  . Hgb A1c w/o eAG  . CBC  with Differential/Platelet  . Comprehensive metabolic panel  . TSH  . Vit D  25 hydroxy (rtn osteoporosis monitoring)  . Vitamin B12  . Ferritin  . UA/M w/rflx Culture, Routine  . Ambulatory referral to Physical Therapy

## 2015-02-06 NOTE — Assessment & Plan Note (Signed)
Pain occurs after sitting on hard stool, also horseback riding; no signs of herpes or shingles; will have her alleviate pressure on that area

## 2015-02-06 NOTE — Assessment & Plan Note (Signed)
Suspect tendonitis, but cannot r/o rotator cuff partial tear; will start with PT and then MRI if not improving

## 2015-02-06 NOTE — Assessment & Plan Note (Signed)
Check B12 level today.  

## 2015-02-07 LAB — COMPREHENSIVE METABOLIC PANEL
A/G RATIO: 2.4 (ref 1.1–2.5)
ALBUMIN: 4.8 g/dL (ref 3.5–5.5)
ALK PHOS: 43 IU/L (ref 39–117)
ALT: 12 IU/L (ref 0–32)
AST: 21 IU/L (ref 0–40)
BUN / CREAT RATIO: 17 (ref 9–23)
BUN: 12 mg/dL (ref 6–24)
Bilirubin Total: 0.5 mg/dL (ref 0.0–1.2)
CO2: 26 mmol/L (ref 18–29)
Calcium: 9.8 mg/dL (ref 8.7–10.2)
Chloride: 102 mmol/L (ref 97–108)
Creatinine, Ser: 0.71 mg/dL (ref 0.57–1.00)
GFR calc Af Amer: 111 mL/min/{1.73_m2} (ref >59)
GFR, EST NON AFRICAN AMERICAN: 96 mL/min/{1.73_m2} (ref >59)
GLOBULIN, TOTAL: 2 g/dL (ref 1.5–4.5)
Glucose: 87 mg/dL (ref 65–99)
POTASSIUM: 4.5 mmol/L (ref 3.5–5.2)
SODIUM: 143 mmol/L (ref 134–144)
Total Protein: 6.8 g/dL (ref 6.0–8.5)

## 2015-02-07 LAB — CBC WITH DIFFERENTIAL/PLATELET
BASOS: 1 %
Basophils Absolute: 0 10*3/uL (ref 0.0–0.2)
EOS (ABSOLUTE): 0.1 10*3/uL (ref 0.0–0.4)
EOS: 2 %
HEMATOCRIT: 40.1 % (ref 34.0–46.6)
HEMOGLOBIN: 14 g/dL (ref 11.1–15.9)
Immature Grans (Abs): 0 10*3/uL (ref 0.0–0.1)
Immature Granulocytes: 0 %
LYMPHS ABS: 1.6 10*3/uL (ref 0.7–3.1)
Lymphs: 44 %
MCH: 31.1 pg (ref 26.6–33.0)
MCHC: 34.9 g/dL (ref 31.5–35.7)
MCV: 89 fL (ref 79–97)
MONOCYTES: 10 %
MONOS ABS: 0.3 10*3/uL (ref 0.1–0.9)
NEUTROS ABS: 1.5 10*3/uL (ref 1.4–7.0)
Neutrophils: 43 %
Platelets: 196 10*3/uL (ref 150–379)
RBC: 4.5 x10E6/uL (ref 3.77–5.28)
RDW: 13.1 % (ref 12.3–15.4)
WBC: 3.6 10*3/uL (ref 3.4–10.8)

## 2015-02-07 LAB — TSH: TSH: 3.25 u[IU]/mL (ref 0.450–4.500)

## 2015-02-07 LAB — HGB A1C W/O EAG: Hgb A1c MFr Bld: 5.4 % (ref 4.8–5.6)

## 2015-02-07 LAB — VITAMIN B12: Vitamin B-12: 264 pg/mL (ref 211–946)

## 2015-02-07 LAB — FERRITIN: FERRITIN: 188 ng/mL — AB (ref 15–150)

## 2015-02-07 LAB — VITAMIN D 25 HYDROXY (VIT D DEFICIENCY, FRACTURES): Vit D, 25-Hydroxy: 40.2 ng/mL (ref 30.0–100.0)

## 2015-02-09 ENCOUNTER — Telehealth: Payer: Self-pay | Admitting: Family Medicine

## 2015-02-09 NOTE — Telephone Encounter (Signed)
Calling about lab results; left message When I reach her... B12 is low; suggest 500 or 1000 mcg sublingual pills 3-4 days a week Thyroid is fine Vit D is fine CBC is fine CMP is fine Ferritin a little elevated; don't take extra iron; recheck B12 and ferritin and TSH in 3 months

## 2015-02-11 DIAGNOSIS — E539 Vitamin B deficiency, unspecified: Secondary | ICD-10-CM | POA: Insufficient documentation

## 2015-02-11 DIAGNOSIS — E039 Hypothyroidism, unspecified: Secondary | ICD-10-CM | POA: Insufficient documentation

## 2015-02-13 ENCOUNTER — Telehealth: Payer: Self-pay | Admitting: Family Medicine

## 2015-02-13 DIAGNOSIS — R7989 Other specified abnormal findings of blood chemistry: Secondary | ICD-10-CM

## 2015-02-13 NOTE — Telephone Encounter (Signed)
Routing to Dr. Lada 

## 2015-02-13 NOTE — Telephone Encounter (Signed)
Pt called stated she is returning Dr. Delight Ovens call in regards to lab results. Please call pt back at your earliest convenience. Thanks.

## 2015-02-14 NOTE — Telephone Encounter (Signed)
I called, left message, trying to reach her again

## 2015-02-15 DIAGNOSIS — R7989 Other specified abnormal findings of blood chemistry: Secondary | ICD-10-CM | POA: Insufficient documentation

## 2015-02-15 NOTE — Telephone Encounter (Signed)
I talked with patient about labs Thyroid normal; not an issue Vit D normal; back to normal Vit B12 low; start 500 or 1000 mcg SL most days of the week Ferritin a little up; avoid cast iron pots; check multivitamin, don't take extra iron; check in 6 months if going up considerably, might check for inherited condition but not high enough now to be a concern CBC normal, urine normal A1C and glucose normal; NOT prediabetic

## 2015-02-18 NOTE — Telephone Encounter (Signed)
I spoke with patient last week; this must have been a duplicate phone message

## 2015-03-05 ENCOUNTER — Ambulatory Visit: Payer: BLUE CROSS/BLUE SHIELD | Admitting: Physical Therapy

## 2015-03-11 ENCOUNTER — Encounter: Payer: BLUE CROSS/BLUE SHIELD | Admitting: Physical Therapy

## 2015-03-14 ENCOUNTER — Encounter: Payer: BLUE CROSS/BLUE SHIELD | Admitting: Physical Therapy

## 2015-03-18 ENCOUNTER — Encounter: Payer: BLUE CROSS/BLUE SHIELD | Admitting: Physical Therapy

## 2015-03-20 ENCOUNTER — Encounter: Payer: BLUE CROSS/BLUE SHIELD | Admitting: Physical Therapy

## 2015-05-14 ENCOUNTER — Emergency Department: Payer: BLUE CROSS/BLUE SHIELD

## 2015-05-14 ENCOUNTER — Emergency Department
Admission: EM | Admit: 2015-05-14 | Discharge: 2015-05-14 | Disposition: A | Discharge status: Home / Self Care | Payer: BLUE CROSS/BLUE SHIELD | Attending: Emergency Medicine | Admitting: Emergency Medicine

## 2015-05-14 ENCOUNTER — Encounter: Payer: Self-pay | Admitting: Emergency Medicine

## 2015-05-14 DIAGNOSIS — Y9352 Activity, horseback riding: Secondary | ICD-10-CM | POA: Insufficient documentation

## 2015-05-14 DIAGNOSIS — Y998 Other external cause status: Secondary | ICD-10-CM | POA: Diagnosis not present

## 2015-05-14 DIAGNOSIS — S060X0A Concussion without loss of consciousness, initial encounter: Secondary | ICD-10-CM

## 2015-05-14 DIAGNOSIS — Y9289 Other specified places as the place of occurrence of the external cause: Secondary | ICD-10-CM | POA: Diagnosis not present

## 2015-05-14 DIAGNOSIS — Z87891 Personal history of nicotine dependence: Secondary | ICD-10-CM | POA: Insufficient documentation

## 2015-05-14 DIAGNOSIS — F0781 Postconcussional syndrome: Secondary | ICD-10-CM

## 2015-05-14 DIAGNOSIS — S0990XA Unspecified injury of head, initial encounter: Secondary | ICD-10-CM | POA: Diagnosis present

## 2015-05-14 DIAGNOSIS — Z79899 Other long term (current) drug therapy: Secondary | ICD-10-CM | POA: Diagnosis not present

## 2015-05-14 IMAGING — CT CT HEAD W/O CM
2 series · 14 of 30 positions shown, 16 images · non-contrast
Comparison: None.

CLINICAL DATA: Fall from horse today, head injury, persistent
headache

EXAM:
CT HEAD WITHOUT CONTRAST
TECHNIQUE: Contiguous axial images were obtained from the base of the skull
through the vertex without intravenous contrast.

[Series 2: head wo · axial · 0.41mm/px · z∈[-187,-88]mm · 6 of 32 slices shown, 8 images]
[im 5/32  brain]
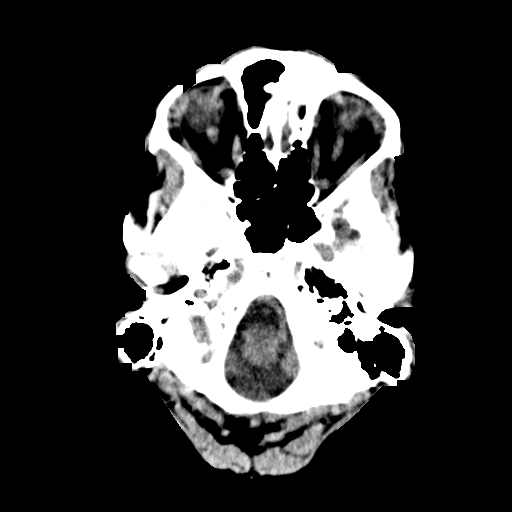
[im 5/32  bone]
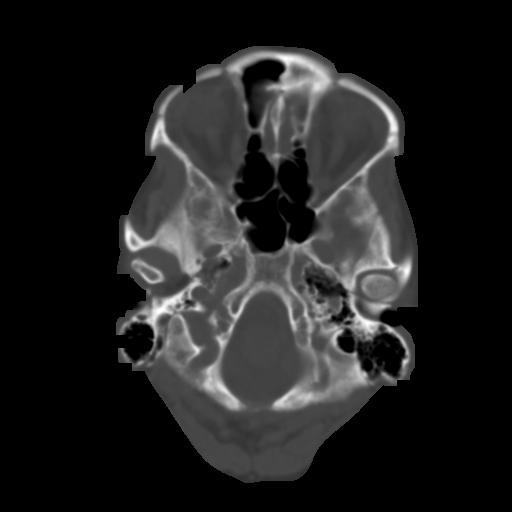
[im 9/32  brain]
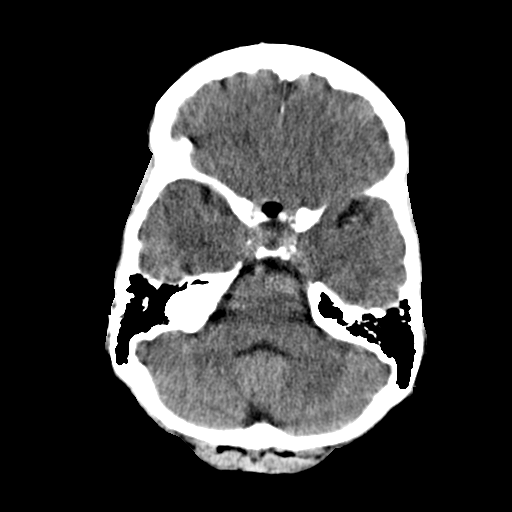
[im 14/32  brain]
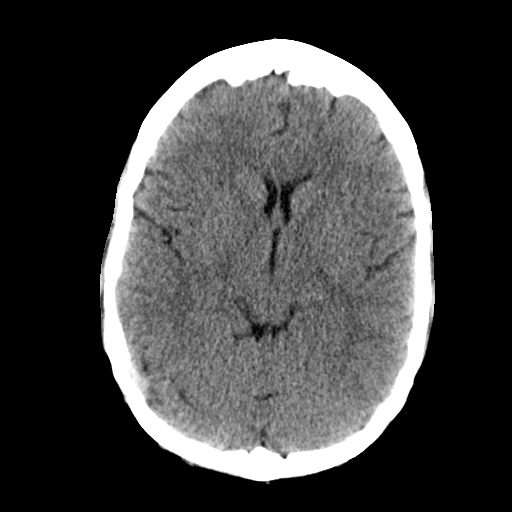
[im 18/32  brain]
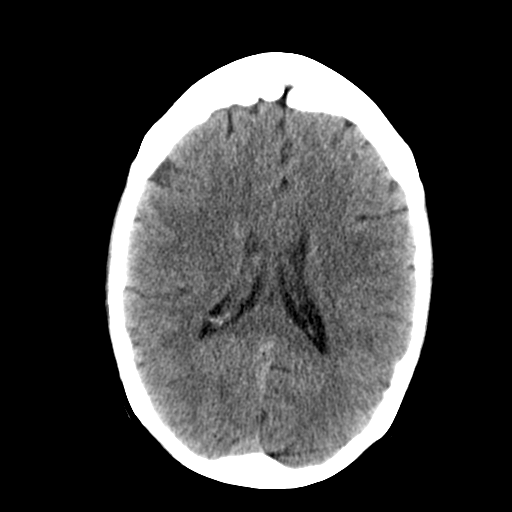
[im 23/32  brain]
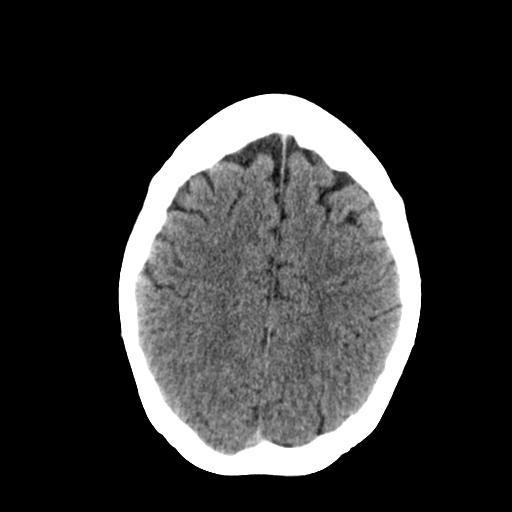
[im 23/32  bone]
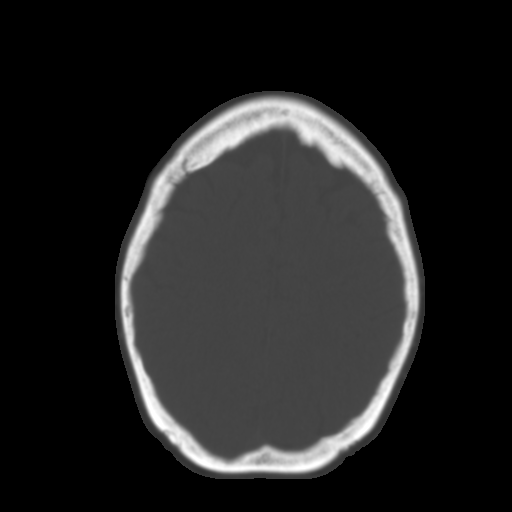
[im 27/32  brain]
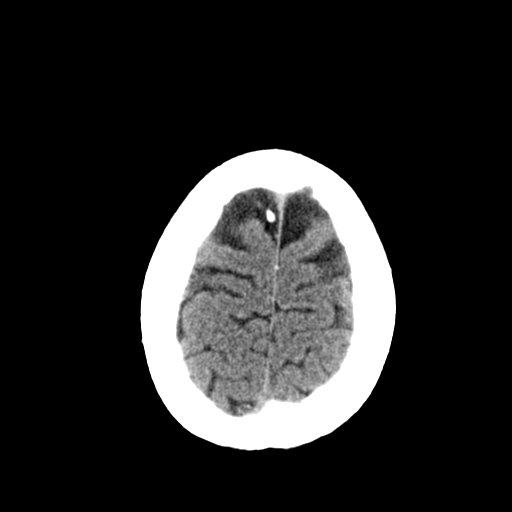

[Series 3: head bone · axial · 0.41mm/px · z∈[-192,-79]mm · 8 of 96 slices shown]
[im 10/96  bone]
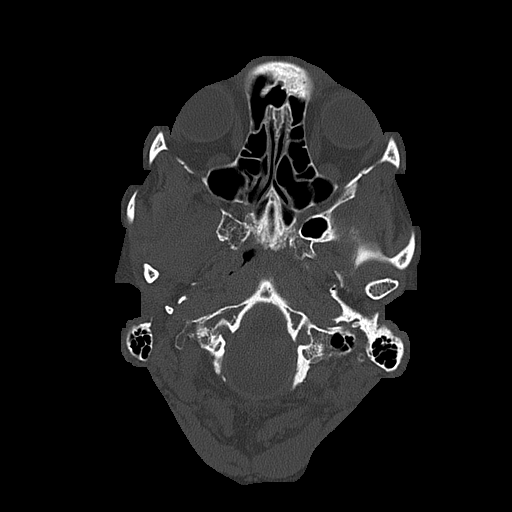
[im 19/96  bone]
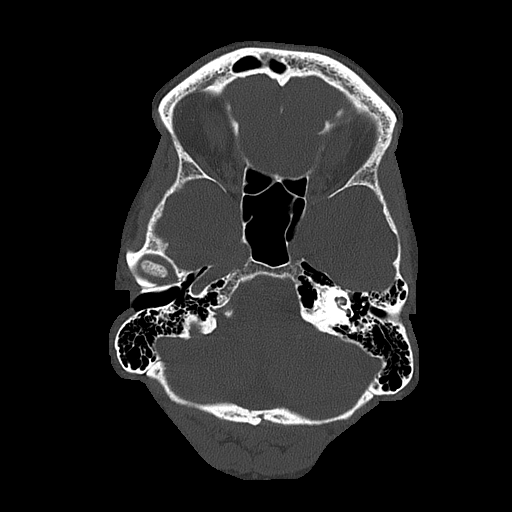
[im 32/96  bone]
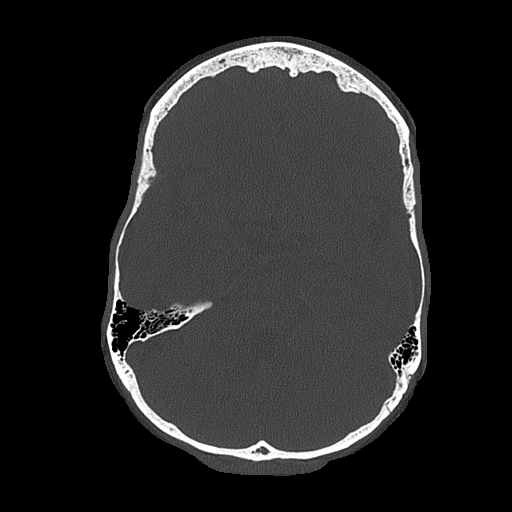
[im 41/96  bone]
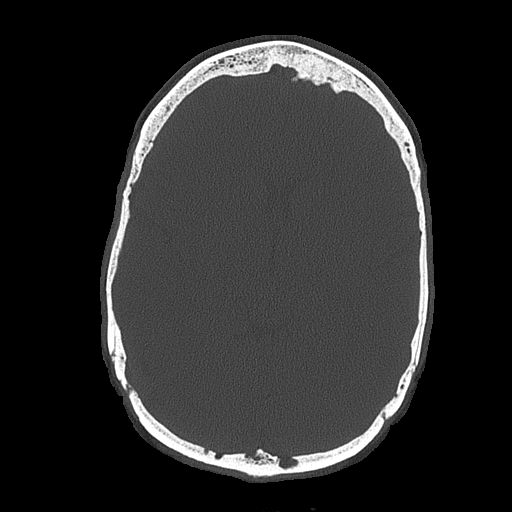
[im 55/96  bone]
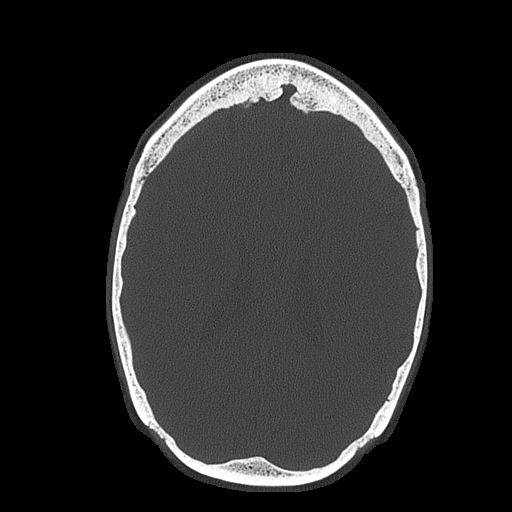
[im 64/96  bone]
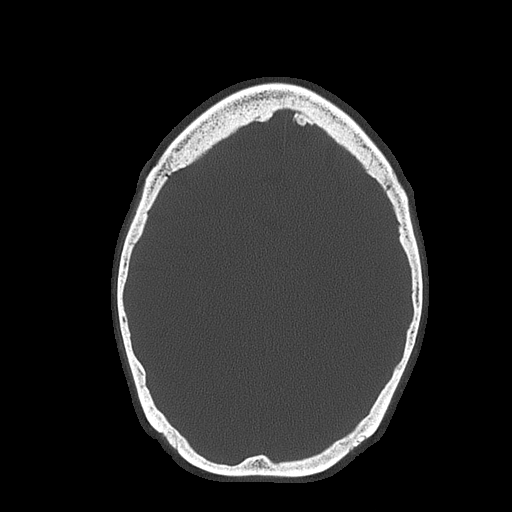
[im 77/96  bone]
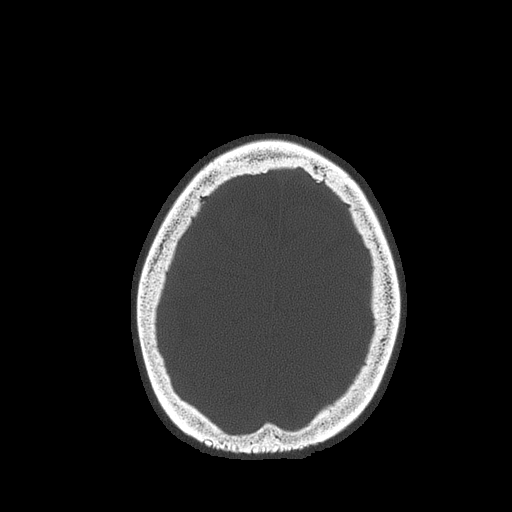
[im 86/96  bone]
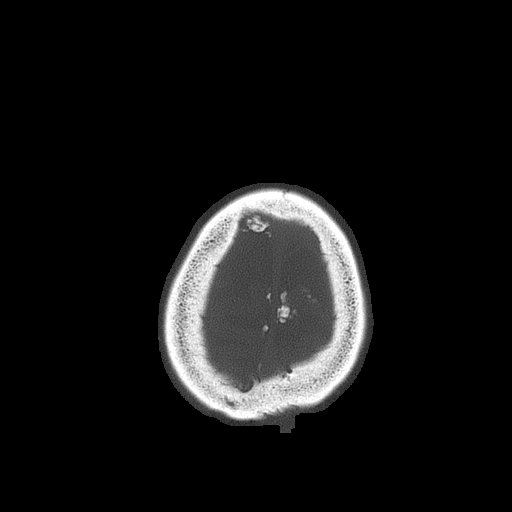

[14 of 30 positions shown; findings below may reference images not displayed]

FINDINGS: No skull fracture is noted. Paranasal sinuses and mastoid air cells
are unremarkable.

No intracranial hemorrhage, mass effect or midline shift. No acute
cortical infarction. No mass lesion is noted on this unenhanced
scan. No hydrocephalus. The gray and white-matter differentiation is
preserved.
IMPRESSION: No acute intracranial abnormality.

## 2015-05-14 MED ORDER — NAPROXEN 500 MG PO TABS: 500.0000 mg | ORAL_TABLET | Freq: Two times a day (BID) | ORAL | Status: DC

## 2015-05-14 MED ORDER — KETOROLAC TROMETHAMINE 30 MG/ML IJ SOLN
30.0000 mg | Freq: Once | INTRAMUSCULAR | Status: AC
  Administered 2015-05-14: 30 mg via INTRAMUSCULAR
  Filled 2015-05-14: qty 1

## 2015-05-14 NOTE — ED Notes (Signed)
Pt here after being thrown off horse last pm, head hit the dirt, no helmet. Pt states she has had a pounding headache since the fall, no broken skin. Nausea last pm, no vomiting.

## 2015-05-14 NOTE — Discharge Instructions (Signed)
Concussion, Adult A concussion, or closed-head injury, is a brain injury caused by a direct blow to the head or by a quick and sudden movement (jolt) of the head or neck. Concussions are usually not life-threatening. Even so, the effects of a concussion can be serious. If you have had a concussion before, you are more likely to experience concussion-like symptoms after a direct blow to the head.  CAUSES  Direct blow to the head, such as from running into another player during a soccer game, being hit in a fight, or hitting your head on a hard surface.  A jolt of the head or neck that causes the brain to move back and forth inside the skull, such as in a car crash. SIGNS AND SYMPTOMS The signs of a concussion can be hard to notice. Early on, they may be missed by you, family members, and health care providers. You may look fine but act or feel differently. Symptoms are usually temporary, but they may last for days, weeks, or even longer. Some symptoms may appear right away while others may not show up for hours or days. Every head injury is different. Symptoms include:  Mild to moderate headaches that will not go away.  A feeling of pressure inside your head.  Having more trouble than usual:  Learning or remembering things you have heard.  Answering questions.  Paying attention or concentrating.  Organizing daily tasks.  Making decisions and solving problems.  Slowness in thinking, acting or reacting, speaking, or reading.  Getting lost or being easily confused.  Feeling tired all the time or lacking energy (fatigued).  Feeling drowsy.  Sleep disturbances.  Sleeping more than usual.  Sleeping less than usual.  Trouble falling asleep.  Trouble sleeping (insomnia).  Loss of balance or feeling lightheaded or dizzy.  Nausea or vomiting.  Numbness or tingling.  Increased sensitivity to:  Sounds.  Lights.  Distractions.  Vision problems or eyes that tire  easily.  Diminished sense of taste or smell.  Ringing in the ears.  Mood changes such as feeling sad or anxious.  Becoming easily irritated or angry for little or no reason.  Lack of motivation.  Seeing or hearing things other people do not see or hear (hallucinations). DIAGNOSIS Your health care provider can usually diagnose a concussion based on a description of your injury and symptoms. He or she will ask whether you passed out (lost consciousness) and whether you are having trouble remembering events that happened right before and during your injury. Your evaluation might include:  A brain scan to look for signs of injury to the brain. Even if the test shows no injury, you may still have a concussion.  Blood tests to be sure other problems are not present. TREATMENT  Concussions are usually treated in an emergency department, in urgent care, or at a clinic. You may need to stay in the hospital overnight for further treatment.  Tell your health care provider if you are taking any medicines, including prescription medicines, over-the-counter medicines, and natural remedies. Some medicines, such as blood thinners (anticoagulants) and aspirin, may increase the chance of complications. Also tell your health care provider whether you have had alcohol or are taking illegal drugs. This information may affect treatment.  Your health care provider will send you home with important instructions to follow.  How fast you will recover from a concussion depends on many factors. These factors include how severe your concussion is, what part of your brain was injured,  your age, and how healthy you were before the concussion. °· Most people with mild injuries recover fully. Recovery can take time. In general, recovery is slower in older persons. Also, persons who have had a concussion in the past or have other medical problems may find that it takes longer to recover from their current injury. °HOME  CARE INSTRUCTIONS °General Instructions °· Carefully follow the directions your health care provider gave you. °· Only take over-the-counter or prescription medicines for pain, discomfort, or fever as directed by your health care provider. °· Take only those medicines that your health care provider has approved. °· Do not drink alcohol until your health care provider says you are well enough to do so. Alcohol and certain other drugs may slow your recovery and can put you at risk of further injury. °· If it is harder than usual to remember things, write them down. °· If you are easily distracted, try to do one thing at a time. For example, do not try to watch TV while fixing dinner. °· Talk with family members or close friends when making important decisions. °· Keep all follow-up appointments. Repeated evaluation of your symptoms is recommended for your recovery. °· Watch your symptoms and tell others to do the same. Complications sometimes occur after a concussion. Older adults with a brain injury may have a higher risk of serious complications, such as a blood clot on the brain. °· Tell your teachers, school nurse, school counselor, coach, athletic trainer, or work manager about your injury, symptoms, and restrictions. Tell them about what you can or cannot do. They should watch for: °¨ Increased problems with attention or concentration. °¨ Increased difficulty remembering or learning new information. °¨ Increased time needed to complete tasks or assignments. °¨ Increased irritability or decreased ability to cope with stress. °¨ Increased symptoms. °· Rest. Rest helps the brain to heal. Make sure you: °¨ Get plenty of sleep at night. Avoid staying up late at night. °¨ Keep the same bedtime hours on weekends and weekdays. °¨ Rest during the day. Take daytime naps or rest breaks when you feel tired. °· Limit activities that require a lot of thought or concentration. These include: °¨ Doing homework or job-related  work. °¨ Watching TV. °¨ Working on the computer. °· Avoid any situation where there is potential for another head injury (football, hockey, soccer, basketball, martial arts, downhill snow sports and horseback riding). Your condition will get worse every time you experience a concussion. You should avoid these activities until you are evaluated by the appropriate follow-up health care providers. °Returning To Your Regular Activities °You will need to return to your normal activities slowly, not all at once. You must give your body and brain enough time for recovery. °· Do not return to sports or other athletic activities until your health care provider tells you it is safe to do so. °· Ask your health care provider when you can drive, ride a bicycle, or operate heavy machinery. Your ability to react may be slower after a brain injury. Never do these activities if you are dizzy. °· Ask your health care provider about when you can return to work or school. °Preventing Another Concussion °It is very important to avoid another brain injury, especially before you have recovered. In rare cases, another injury can lead to permanent brain damage, brain swelling, or death. The risk of this is greatest during the first 7-10 days after a head injury. Avoid injuries by: °· Wearing a   your health care provider when you can drive, ride a bicycle, or operate heavy machinery. Your ability to react may be slower after a brain injury. Never do these activities if you are dizzy.   Ask your health care provider about when you can return to work or school.  Preventing Another Concussion  It is very important to avoid another brain injury, especially before you have recovered. In rare cases, another injury can lead to permanent brain damage, brain swelling, or death. The risk of this is greatest during the first 7-10 days after a head injury. Avoid injuries by:   Wearing a seat belt when riding in a car.   Drinking alcohol only in moderation.   Wearing a helmet when biking, skiing, skateboarding, skating, or doing similar activities.   Avoiding activities that could lead to a second concussion, such as contact or recreational sports, until your health care provider says it is okay.   Taking safety measures in your home.    Remove clutter and tripping hazards from floors and stairways.    Use grab bars in bathrooms and handrails by stairs.    Place non-slip mats on floors and in bathtubs.    Improve lighting in dim areas.  SEEK MEDICAL CARE IF:   You have increased problems paying attention or  concentrating.   You have increased difficulty remembering or learning new information.   You need more time to complete tasks or assignments than before.   You have increased irritability or decreased ability to cope with stress.   You have more symptoms than before.  Seek medical care if you have any of the following symptoms for more than 2 weeks after your injury:   Lasting (chronic) headaches.   Dizziness or balance problems.   Nausea.   Vision problems.   Increased sensitivity to noise or light.   Depression or mood swings.   Anxiety or irritability.   Memory problems.   Difficulty concentrating or paying attention.   Sleep problems.   Feeling tired all the time.  SEEK IMMEDIATE MEDICAL CARE IF:   You have severe or worsening headaches. These may be a sign of a blood clot in the brain.   You have weakness (even if only in one hand, leg, or part of the face).   You have numbness.   You have decreased coordination.   You vomit repeatedly.   You have increased sleepiness.   One pupil is larger than the other.   You have convulsions.   You have slurred speech.   You have increased confusion. This may be a sign of a blood clot in the brain.   You have increased restlessness, agitation, or irritability.   You are unable to recognize people or places.   You have neck pain.   It is difficult to wake you up.   You have unusual behavior changes.   You lose consciousness.  MAKE SURE YOU:   Understand these instructions.   Will watch your condition.   Will get help right away if you are not doing well or get worse.     This information is not intended to replace advice given to you by your health care provider. Make sure you discuss any questions you have with your health care provider.     Document Released: 08/08/2003 Document Revised: 06/08/2014 Document Reviewed: 12/08/2012  [employer]vier Interactive Patient Education 2016 [employer]  Head Injury, Adult  You have received a head  injury. It does not   appear serious at this time. Headaches and vomiting are common following head injury. It should be easy to awaken from sleeping. Sometimes it is necessary for you to stay in the emergency department for a while for observation. Sometimes admission to the hospital may be needed. After injuries such as yours, most problems occur within the first 24 hours, but side effects may occur up to 7-10 days after the injury. It is important for you to carefully monitor your condition and contact your health care provider or seek immediate medical care if there is a change in your condition.  WHAT ARE THE TYPES OF HEAD INJURIES?  Head injuries can be as minor as a bump. Some head injuries can be more severe. More severe head injuries include:   A jarring injury to the brain (concussion).   A bruise of the brain (contusion). This mean there is bleeding in the brain that can cause swelling.   A cracked skull (skull fracture).   Bleeding in the brain that collects, clots, and forms a bump (hematoma).  WHAT CAUSES A HEAD INJURY?  A serious head injury is most likely to happen to someone who is in a car wreck and is not wearing a seat belt. Other causes of major head injuries include bicycle or motorcycle accidents, sports injuries, and falls.  HOW ARE HEAD INJURIES DIAGNOSED?  A complete history of the event leading to the injury and your current symptoms will be helpful in diagnosing head injuries. Many times, pictures of the brain, such as CT or MRI are needed to see the extent of the injury. Often, an overnight hospital stay is necessary for observation.   WHEN SHOULD I SEEK IMMEDIATE MEDICAL CARE?   You should get help right away if:   You have confusion or drowsiness.   You feel sick to your stomach (nauseous) or have continued, forceful vomiting.   You have dizziness or unsteadiness that is getting worse.   You have severe, continued headaches not relieved by medicine. Only take over-the-counter or  prescription medicines for pain, fever, or discomfort as directed by your health care provider.   You do not have normal function of the arms or legs or are unable to walk.   You notice changes in the black spots in the center of the colored part of your eye (pupil).   You have a clear or bloody fluid coming from your nose or ears.   You have a loss of vision.  During the next 24 hours after the injury, you must stay with someone who can watch you for the warning signs. This person should contact local emergency services (911 in the [place] if you have seizures, you become unconscious, or you are unable to wake up.  HOW CAN I PREVENT A HEAD INJURY IN THE FUTURE?  The most important factor for preventing major head injuries is avoiding motor vehicle accidents. To minimize the potential for damage to your head, it is crucial to wear seat belts while riding in motor vehicles. Wearing helmets while bike riding and playing collision sports (like football) is also helpful. Also, avoiding dangerous activities around the house will further help reduce your risk of head injury.   WHEN CAN I RETURN TO NORMAL ACTIVITIES AND ATHLETICS?  You should be reevaluated by your health care provider before returning to these activities. If you have any of the following symptoms, you should not return to activities or contact sports until 1 week after the symptoms have stopped:  

## 2015-05-14 NOTE — ED Provider Notes (Signed)
CSN: [id number]     Arrival date & time 05/14/15  [id number] History   First MD Initiated Contact with Patient 05/14/15 1032     Chief Complaint  Patient presents with  . Head Injury     (Consider location/radiation/quality/duration/timing/severity/associated sxs/prior Treatment) HPI Comments: 54 year old female presents today complaining of headache secondary to head injury that occurred last evening. Patient reports that she was riding her horse when he got spooked and threw her off. She was not wearing a helmet at the time of the accident. She did not fall head first. She did not lose consciousness. Has not noticed any hematoma or swelling to her head. Does not complain of any neck pain. Called her primary care physician and was referred here for further evaluation. Nausea last evening but it has resolved. No vision problems, balance problems or any noted deficits.  The history is provided by the patient.    Past Medical History  Diagnosis Date  . Gross hematuria   . Hypothyroidism   . Fatigue   . Vitamin B12 deficiency   . History of panic attacks   . Fall from horse 11/2012    left leg injury   Past Surgical History  Procedure Laterality Date  . Abdominal hysterectomy    . Breast biopsy    . Breast lumpectomy     Family History  Problem Relation Age of Onset  . Diabetes Mother   . Cancer Mother     breast  . Heart disease Father   . Heart attack Father   . Hypertension Father   . Diabetes Sister   . Diabetes Brother    Social History  Substance Use Topics  . Smoking status: Former Research scientist (life sciences)  . Smokeless tobacco: Never Used  . Alcohol Use: Yes     Comment: wine only   OB History    No data available     Review of Systems  Eyes: Negative for visual disturbance.  Gastrointestinal: Positive for nausea. Negative for vomiting.  Musculoskeletal: Negative for myalgias, back pain, arthralgias, gait problem, neck pain and neck stiffness.  Neurological: Positive for  headaches. Negative for dizziness, seizures, speech difficulty, weakness, light-headedness and numbness.  All other systems reviewed and are negative.     Allergies  Cephalexin and Tetracycline  Home Medications   Prior to Admission medications   Medication Sig Start Date End Date Taking? Authorizing Provider  BIOTIN PO Take by mouth daily.    Historical Provider, MD  Cholecalciferol (VITAMIN D PO) Take by mouth daily.    Historical Provider, MD  levothyroxine (SYNTHROID, LEVOTHROID) 25 MCG tablet Take 25 mcg by mouth daily before breakfast.    Historical Provider, MD  Multiple Vitamin (MULTIVITAMIN) capsule Take by mouth daily.    Historical Provider, MD  naproxen (NAPROSYN) 500 MG tablet Take 1 tablet (500 mg total) by mouth 2 (two) times daily with a meal. 05/14/15   Shayne Alken V, PA-C  Omega-3 Fatty Acids (FISH OIL PO) Take by mouth daily.    Historical Provider, MD  omeprazole (PRILOSEC) 40 MG capsule Take 40 mg by mouth daily.    Historical Provider, MD  vitamin B-12 (CYANOCOBALAMIN) 500 MCG tablet Take 500 mcg by mouth daily.    Historical Provider, MD  vitamin E 100 UNIT capsule Take by mouth daily.    Historical Provider, MD   Pulse 91  Temp(Src) 98 F (36.7 C) (Oral)  Resp 18  Ht 5\' 7"  (1.702 m)  Wt 64.411 kg  BMI 22.24  kg/m2  SpO2 99% Physical Exam  Constitutional: She is oriented to person, place, and time. Vital signs are normal. She appears well-developed and well-nourished. She is active.  Non-toxic appearance. She does not have a sickly appearance. She does not appear ill.  HENT:  Head: Normocephalic and atraumatic.  Right Ear: Tympanic membrane and external ear normal.  Left Ear: Tympanic membrane and external ear normal.  Nose: Nose normal.  Mouth/Throat: Uvula is midline, oropharynx is clear and moist and mucous membranes are normal.  No hematoma noted to scalp.  Eyes: Conjunctivae and EOM are normal. Pupils are equal, round, and reactive to light.  Neck:  Trachea normal, normal range of motion, full passive range of motion without pain and phonation normal. Neck supple. No spinous process tenderness and no muscular tenderness present. Normal range of motion present.  Cardiovascular: Normal rate, regular rhythm, normal heart sounds and intact distal pulses.  Exam reveals no gallop and no friction rub.   No murmur heard. Pulmonary/Chest: Effort normal and breath sounds normal. No respiratory distress. She has no wheezes. She has no rales.  Neurological: She is alert and oriented to person, place, and time. She has normal strength. No cranial nerve deficit or sensory deficit. She exhibits normal muscle tone. She displays a negative Romberg sign. Coordination and gait normal. GCS eye subscore is 4. GCS verbal subscore is 5. GCS motor subscore is 6.  Normal gait, no ataxia  Skin: Skin is warm and dry.  Psychiatric: She has a normal mood and affect. Her behavior is normal. Judgment and thought content normal.  Nursing note and vitals reviewed.   ED Course  Procedures (including critical care time) Labs Review Labs Reviewed - No data to display  Imaging Review Ct Head Wo Contrast  05/14/2015  CLINICAL DATA:  Fall from horse today, head injury, persistent headache EXAM: CT HEAD WITHOUT CONTRAST TECHNIQUE: Contiguous axial images were obtained from the base of the skull through the vertex without intravenous contrast. COMPARISON:  None. FINDINGS: No skull fracture is noted. Paranasal sinuses and mastoid air cells are unremarkable. No intracranial hemorrhage, mass effect or midline shift. No acute cortical infarction. No mass lesion is noted on this unenhanced scan. No hydrocephalus. The gray and white-matter differentiation is preserved. IMPRESSION: No acute intracranial abnormality. Electronically Signed   By: Lahoma Crocker M.D.   On: 05/14/2015 09:58   I have personally reviewed and evaluated these images and lab results as part of my medical  decision-making.   EKG Interpretation None      MDM  Per the radiologist, Dr. Candi Leash report there is no right of acute intracranial abnormality or skull fracture. I discussed the findings with patient. I do think her symptoms are suggestive of a concussion. I advised her to take naproxen twice per day as needed for headache and avoid alcohol and any sedating medications. If she develops any vomiting, worsening headache, neurologic deficits she is to return to the ER medially. She is to follow up with her primary care physician this week for repeat evaluation. Final diagnoses:  Concussion, without loss of consciousness, initial encounter  Post concussion syndrome        Corliss Parish, PA-C 05/14/15 [place], MD 05/14/15 [phone]

## 2015-06-07 ENCOUNTER — Ambulatory Visit (INDEPENDENT_AMBULATORY_CARE_PROVIDER_SITE_OTHER): Payer: BLUE CROSS/BLUE SHIELD | Admitting: Family Medicine

## 2015-06-07 ENCOUNTER — Encounter: Payer: Self-pay | Admitting: Family Medicine

## 2015-06-07 VITALS — BP 128/82 | HR 81 | Temp 96.6°F | Ht 66.0 in | Wt 147.6 lb

## 2015-06-07 DIAGNOSIS — R7989 Other specified abnormal findings of blood chemistry: Secondary | ICD-10-CM | POA: Diagnosis not present

## 2015-06-07 DIAGNOSIS — Z1159 Encounter for screening for other viral diseases: Secondary | ICD-10-CM | POA: Diagnosis not present

## 2015-06-07 DIAGNOSIS — R3129 Other microscopic hematuria: Secondary | ICD-10-CM

## 2015-06-07 DIAGNOSIS — Z23 Encounter for immunization: Secondary | ICD-10-CM

## 2015-06-07 DIAGNOSIS — Z114 Encounter for screening for human immunodeficiency virus [HIV]: Secondary | ICD-10-CM

## 2015-06-07 DIAGNOSIS — M25552 Pain in left hip: Secondary | ICD-10-CM | POA: Diagnosis not present

## 2015-06-07 LAB — CBC WITH DIFFERENTIAL/PLATELET
HEMATOCRIT: 39.6 % (ref 34.0–46.6)
Hemoglobin: 13.9 g/dL (ref 11.1–15.9)
LYMPHS: 42 %
Lymphocytes Absolute: 1.4 10*3/uL (ref 0.7–3.1)
MCH: 31.7 pg (ref 26.6–33.0)
MCHC: 35.1 g/dL (ref 31.5–35.7)
MCV: 90 fL (ref 79–97)
MID (Absolute): 0.4 10*3/uL (ref 0.1–1.6)
MID: 11 %
Neutrophils Absolute: 1.6 10*3/uL (ref 1.4–7.0)
Neutrophils: 47 %
PLATELETS: 217 10*3/uL (ref 150–379)
RBC: 4.38 x10E6/uL (ref 3.77–5.28)
RDW: 12.3 % (ref 12.3–15.4)
WBC: 3.4 10*3/uL (ref 3.4–10.8)

## 2015-06-07 MED ORDER — CYCLOBENZAPRINE HCL 5 MG PO TABS: 5.0000 mg | ORAL_TABLET | Freq: Three times a day (TID) | ORAL | Status: DC | PRN

## 2015-06-07 NOTE — Assessment & Plan Note (Signed)
Recheck today. 

## 2015-06-07 NOTE — Progress Notes (Signed)
BP 128/82 mmHg  Pulse 81  Temp(Src) 96.6 F (35.9 C)  Ht 5\' 6"  (1.676 m)  Wt 147 lb 9.6 oz (66.951 kg)  BMI 23.83 kg/m2  SpO2 99%  Subjective:    Patient ID: Vickie Drake, female    DOB: 07/16/1960, 55 y.o.   MRN: [id number]  HPI: Vickie Drake is a 55 y.o. female  Chief Complaint  Patient presents with  . Leg Pain    Upper left leg (backside)   She has pain in the left buttock When she sits on the bone on the left buttock; it's in the bone she thinks, right at a certain spot Hard time walking, pain running down the middle of the back of the left thigh She has sharp shooting pain into the private area No rash at all Hurts to sit or walk She rides horses Going on for months, never got better, getting worse Gets in a hot bath and relaxes Bladder is always leaking, not new; only if sneezes, laughing; has to be something really hard No loss of stool control No numbness or tingling, just a burning sensation Just constant and nagging; feels like a boil on the bone When she is partially flexed at the left hip, really hurts in the upper posterior thigh This is the leg that she hurt with horse accident She had a vascular procedure done on the left leg too, collapsed the vein; leg otherwise looks fine, not cold or blue, no cold or numbness No fevers, but would feel chilled at times She had an orthopaedist, but he has recently retired  She fell off of her horse and hit her head, had a concussion; was riding at night without her helmet on; she blacked out a second maybe; no confusion; reviewed ER notes from May 14, 2015  We reviewed last note here and labs; she is taking B12 SL now; also had mildly elevated ferritin; not taking any extra iron now  She has a hx of hematuria; years ago, started work-up with Dr. Yves Dill but not interested in cystoscopy; she had urine cytology done in 2014; she just really doesn't want to have cystoscopy done and says if she had cancer, she'd be  gone by now  Relevant past medical, surgical, family and social history reviewed and updated as indicated. Interim medical history since our last visit reviewed. Allergies and medications reviewed and updated.  Review of Systems Per HPI unless specifically indicated above     Objective:    BP 128/82 mmHg  Pulse 81  Temp(Src) 96.6 F (35.9 C)  Ht 5\' 6"  (1.676 m)  Wt 147 lb 9.6 oz (66.951 kg)  BMI 23.83 kg/m2  SpO2 99%  Wt Readings from Last 3 Encounters:  06/07/15 147 lb 9.6 oz (66.951 kg)  05/14/15 142 lb (64.411 kg)  02/06/15 147 lb (66.679 kg)    Physical Exam  Constitutional: She appears well-developed and well-nourished. No distress.  HENT:  Head: Normocephalic.  Eyes: EOM are normal. No scleral icterus.  Cardiovascular: Normal rate.   Pulmonary/Chest: Effort normal.  Abdominal: Soft. She exhibits no distension.  Musculoskeletal:       Lumbar back: She exhibits normal range of motion, no tenderness, no bony tenderness, no swelling, no deformity, no pain and no spasm.  There is discrete point tenderness to palpation over the left medial ischial tuberosity and along the lateral aspects of the attaching pelvic floor muscles there  Neurological: She displays no atrophy and no tremor. She exhibits normal  muscle tone. Gait normal.  Reflex Scores:      Patellar reflexes are 2+ on the right side and 2+ on the left side. LE strength 5/5  Skin: No rash (specifically, no rash involving the lower back, buttock, or groin on the left side) noted. No pallor.  No significant palmar erythema  Psychiatric: She has a normal mood and affect.      Assessment & Plan:   Problem List Items Addressed This Visit      Genitourinary   Hematuria, microscopic    None on last exam; patient does not want to undergo cystoscopy; she is willing to do urine cytology again      Relevant Orders   Cytology - non gyn     Other   Left-sided ischial pain - Primary    Will have her return to see  our D.O. Here for consideration of OMM, pelvic musculature treatment; suspect there is inflammation along the medial aspect of the ischial tuberosity; she will return to see Dr. Wynetta Emery; in the meantime, try anti-inflammatory; discussed cardiac risk, suggested taking aspirin 81 mg one hour prior to naproxen (aleve) to minimize risk; also suggested padded riding pants to help prevent pressure along that left ischial tuberosity; if not improving, CT scan or MRI to look for occult fracture, other bony abnormality or tumor      Elevated ferritin level    Recheck today      Relevant Orders   CBC With Differential/Platelet (Completed)   Ferritin (Completed)   Encounter for screening for HIV    One-time testing offered, drawn today      Relevant Orders   HIV antibody (Completed)   Need for hepatitis C screening test    One-time hep C test offered and drawn today      Relevant Orders   Hepatitis C antibody (Completed)    Other Visit Diagnoses    Need for influenza vaccination        Relevant Orders    Flu Vaccine QUAD 36+ mos PF IM (Fluarix & Fluzone Quad PF) (Completed)       Follow up plan: Return 1 week, for visit with Dr. Wynetta Emery (OMM evaluation).  An after-visit summary was printed and given to the patient at [place] [facility].  Please see the patient instructions which may contain other information and recommendations beyond what is mentioned above in the assessment and plan.  Meds ordered this encounter  Medications  . cyclobenzaprine (FLEXERIL) 5 MG tablet    Sig: Take 1 tablet (5 mg total) by mouth every 8 (eight) hours as needed for muscle spasms.    Dispense:  30 tablet    Refill:  1   Orders Placed This Encounter  Procedures  . Flu Vaccine QUAD 36+ mos PF IM (Fluarix & Fluzone Quad PF)  . CBC With Differential/Platelet  . Ferritin  . Hepatitis C antibody  . HIV antibody   Additional order (urine cytology) was placed, but not showing up in orders placed this encounter  (above); will have staff check to make sure this was collected and is being tested; if not, will ask patient to submit another urine sample Copied and pasted ordered below:  Cytology - non gyn  Summary: Today, Resulting Agency - [facility] Report   Cancel   Ordered on: 06/07/2015   Authorized by: LADA, MELINDA P

## 2015-06-07 NOTE — Patient Instructions (Signed)
Use the muscle relaxant if needed; don't drive for 8 hours after taking Anti-inflammatory recommended for inflammation, aleve 220 mg one or two pills every twelve hours, take with food or milk Take a baby aspirin 81 mg coated one hour prior to the morning aleve to reduce cardiac risk We'll get the labs Try gel seat for horseback riding Return to see Dr. Wynetta Emery

## 2015-06-07 NOTE — Assessment & Plan Note (Addendum)
None on last exam; patient does not want to undergo cystoscopy; she is willing to do urine cytology again

## 2015-06-08 LAB — HIV ANTIBODY (ROUTINE TESTING W REFLEX): HIV Screen 4th Generation wRfx: NONREACTIVE

## 2015-06-08 LAB — HEPATITIS C ANTIBODY: Hep C Virus Ab: <0.1 s/co ratio (ref 0.0–0.9)

## 2015-06-08 LAB — FERRITIN: FERRITIN: 218 ng/mL — AB (ref 15–150)

## 2015-06-09 NOTE — Assessment & Plan Note (Signed)
Will have her return to see our D.O. Here for consideration of OMM, pelvic musculature treatment; suspect there is inflammation along the medial aspect of the ischial tuberosity; she will return to see Dr. Wynetta Emery; in the meantime, try anti-inflammatory; discussed cardiac risk, suggested taking aspirin 81 mg one hour prior to naproxen (aleve) to minimize risk; also suggested padded riding pants to help prevent pressure along that left ischial tuberosity; if not improving, CT scan or MRI to look for occult fracture, other bony abnormality or tumor

## 2015-06-09 NOTE — Assessment & Plan Note (Signed)
One-time hep C test offered and drawn today

## 2015-06-09 NOTE — Assessment & Plan Note (Signed)
One-time testing offered, drawn today

## 2015-06-11 ENCOUNTER — Ambulatory Visit: Payer: BLUE CROSS/BLUE SHIELD | Admitting: Family Medicine

## 2015-06-12 ENCOUNTER — Ambulatory Visit (INDEPENDENT_AMBULATORY_CARE_PROVIDER_SITE_OTHER): Payer: BLUE CROSS/BLUE SHIELD | Admitting: Family Medicine

## 2015-06-12 ENCOUNTER — Telehealth: Payer: Self-pay

## 2015-06-12 ENCOUNTER — Encounter: Payer: Self-pay | Admitting: Family Medicine

## 2015-06-12 VITALS — BP 129/83 | HR 71 | Temp 97.9°F | Ht 65.5 in | Wt 148.0 lb

## 2015-06-12 DIAGNOSIS — M9906 Segmental and somatic dysfunction of lower extremity: Secondary | ICD-10-CM | POA: Diagnosis not present

## 2015-06-12 DIAGNOSIS — M25552 Pain in left hip: Secondary | ICD-10-CM | POA: Diagnosis not present

## 2015-06-12 DIAGNOSIS — M9904 Segmental and somatic dysfunction of sacral region: Secondary | ICD-10-CM | POA: Diagnosis not present

## 2015-06-12 DIAGNOSIS — M9905 Segmental and somatic dysfunction of pelvic region: Secondary | ICD-10-CM

## 2015-06-12 NOTE — Telephone Encounter (Signed)
Patient wanted to let us know that she went to have her urine cytology specimen collected at [facility] on [address] today.  She states they did not give her a wipe to use before collecting her sample and that there was no sink in the restroom for her to wash her hands afterwards. She just wanted [place] to be aware if anything on her result came back odd.

## 2015-06-12 NOTE — Progress Notes (Signed)
BP 129/83 mmHg  Pulse 71  Temp(Src) 97.9 F (36.6 C)  Ht 5' 5.5" (1.664 m)  Wt 148 lb (67.132 kg)  BMI 24.25 kg/m2  SpO2 98%   Subjective:    Patient ID: Vickie Drake, female    DOB: 11-05-1960, 55 y.o.   MRN: [id number]  HPI: Vickie Drake is a 55 y.o. female  Chief Complaint  Patient presents with  . Leg Pain    Patient states that she is having pain in her left leg up very high under her buttucks. She states that she has been having this for about a month.   Been going on for about 2 months. Came on pretty suddenly. She has changed the way she was riding horses about that time, and wonders if that is possibly what's causing it.  Up deep in her pelvis on the left sides. Waxes and wanes. Constant and irritating. At night, hurts and keeps her up at times. Gets electric shocks into her pelvis near the vagina at night. Sitting makes it worse. Moving around makes it better. Hot baths help. No other recent injuries. 2 years had a horse step on the L leg up by the thigh and caused a lot of damage. Had vericose vein stripping on that leg about 5 years ago.   Relevant past medical, surgical, family and social history reviewed and updated as indicated. Interim medical history since our last visit reviewed. Allergies and medications reviewed and updated.  Review of Systems  Constitutional: Negative.   Respiratory: Negative.   Cardiovascular: Negative.   Genitourinary: Negative.   Musculoskeletal: Positive for myalgias, back pain, arthralgias and gait problem. Negative for joint swelling, neck pain and neck stiffness.  Skin: Negative.   Psychiatric/Behavioral: Negative.     Per HPI unless specifically indicated above     Objective:    BP 129/83 mmHg  Pulse 71  Temp(Src) 97.9 F (36.6 C)  Ht 5' 5.5" (1.664 m)  Wt 148 lb (67.132 kg)  BMI 24.25 kg/m2  SpO2 98%  Wt Readings from Last 3 Encounters:  06/12/15 148 lb (67.132 kg)  06/07/15 147 lb 9.6 oz (66.951 kg)  05/14/15  142 lb (64.411 kg)    Physical Exam  Constitutional: She is oriented to person, place, and time. She appears well-developed and well-nourished. No distress.  HENT:  Head: Normocephalic and atraumatic.  Right Ear: Hearing normal.  Left Ear: Hearing normal.  Nose: Nose normal.  Eyes: Conjunctivae and lids are normal. Right eye exhibits no discharge. Left eye exhibits no discharge. No scleral icterus.  Pulmonary/Chest: Effort normal. No respiratory distress.  Neurological: She is alert and oriented to person, place, and time.  Skin: Skin is intact. No rash noted.  Psychiatric: She has a normal mood and affect. Her speech is normal and behavior is normal. Judgment and thought content normal. Cognition and memory are normal.  Nursing note and vitals reviewed. Musculoskeletal:  Exam found Decreased ROM, Tissue texture changes, Tenderness to palpation and Asymmetry of patient's  pelvis, sacrum and lower extremity Osteopathic Structural Exam:   Pelvis: Anterior L innominate, R pubic inferior sheer  Sacrum: L on L torsion  Lower Extremity: L long head hamstring origin tenderpoint and hypertonicity with fascial strain through L leg- pull, Adductor hypertonicity on the L   Results for orders placed or performed in visit on 06/07/15  CBC With Differential/Platelet  Result Value Ref Range   WBC 3.4 3.4 - 10.8 x10E3/uL   RBC 4.38 3.77 - 5.28  x10E6/uL   Hemoglobin 13.9 11.1 - 15.9 g/dL   Hematocrit 39.6 34.0 - 46.6 %   MCV 90 79 - 97 fL   MCH 31.7 26.6 - 33.0 pg   MCHC 35.1 31.5 - 35.7 g/dL   RDW 12.3 12.3 - 15.4 %   Platelets 217 150 - 379 x10E3/uL   Neutrophils 47 %   Lymphs 42 %   MID 11 %   Neutrophils Absolute 1.6 1.4 - 7.0 x10E3/uL   Lymphocytes Absolute 1.4 0.7 - 3.1 x10E3/uL   MID (Absolute) 0.4 0.1 - 1.6 X10E3/uL  Ferritin  Result Value Ref Range   Ferritin 218 (H) 15 - 150 ng/mL  Hepatitis C antibody  Result Value Ref Range   Hep C Virus Ab <0.1 0.0 - 0.9 s/co ratio  HIV  antibody  Result Value Ref Range   HIV Screen 4th Generation wRfx Non Reactive Non Reactive      Assessment & Plan:   Problem List Items Addressed This Visit      Other   Left-sided ischial pain - Primary    Seems to be due to hamstring strain from horse back riding. She does have some somatic dysfunction that I think would benefit from OMT. Treated today as discussed below. Return for reevaluation in 1-2 weeks.        Other Visit Diagnoses    Somatic dysfunction of lower extremity        Somatic dysfunction of pelvic region        Somatic dysfunction of sacral region          After verbal consent was obtained, patient was treated today with osteopathic manipulative medicine to the regions of the pelvis, sacrum and lower extremity using the techniques of myofascial release, counterstrain, muscle energy and soft tissue. Areas of compensation relating to her primary pain source also treated. Patient tolerated the procedure well with good objective and good subjective improvement in symptoms. She left the room in good condition. She was advised to stay well hydrated and that she may have some soreness following the procedure. If not improving or worsening, she will call and come in. Home exercise program of stretches for hamstring and adductor discussed and demonstrated today. Patient will do these stretches BID to before the point of pain, and will return for reevaluation  in 1-2 weeks.   Follow up plan: Return in about 1 week (around 06/19/2015).

## 2015-06-12 NOTE — Assessment & Plan Note (Signed)
Seems to be due to hamstring strain from horse back riding. She does have some somatic dysfunction that I think would benefit from OMT. Treated today as discussed below. Return for reevaluation in 1-2 weeks.

## 2015-06-14 ENCOUNTER — Other Ambulatory Visit: Payer: Self-pay | Admitting: Family Medicine

## 2015-06-14 DIAGNOSIS — R7989 Other specified abnormal findings of blood chemistry: Secondary | ICD-10-CM

## 2015-06-14 NOTE — Assessment & Plan Note (Signed)
Refer to heme

## 2015-06-15 ENCOUNTER — Telehealth: Payer: Self-pay | Admitting: Family Medicine

## 2015-06-15 DIAGNOSIS — R3129 Other microscopic hematuria: Secondary | ICD-10-CM

## 2015-06-15 DIAGNOSIS — R8289 Other abnormal findings on cytological and histological examination of urine: Secondary | ICD-10-CM

## 2015-06-15 NOTE — Telephone Encounter (Signed)
Urine cytology report:  Negative for malignant cells Transitional epithelial cells are present Rec: suggest follow up as clinically appropriate

## 2015-06-18 ENCOUNTER — Ambulatory Visit: Payer: BLUE CROSS/BLUE SHIELD | Admitting: Family Medicine

## 2015-06-21 ENCOUNTER — Encounter: Payer: Self-pay | Admitting: Internal Medicine

## 2015-06-21 ENCOUNTER — Inpatient Hospital Stay: Payer: BLUE CROSS/BLUE SHIELD | Attending: Internal Medicine | Admitting: Internal Medicine

## 2015-06-21 ENCOUNTER — Inpatient Hospital Stay: Payer: BLUE CROSS/BLUE SHIELD

## 2015-06-21 DIAGNOSIS — E039 Hypothyroidism, unspecified: Secondary | ICD-10-CM

## 2015-06-21 DIAGNOSIS — Z87891 Personal history of nicotine dependence: Secondary | ICD-10-CM | POA: Diagnosis not present

## 2015-06-21 DIAGNOSIS — M25552 Pain in left hip: Secondary | ICD-10-CM | POA: Diagnosis not present

## 2015-06-21 DIAGNOSIS — Z9181 History of falling: Secondary | ICD-10-CM | POA: Insufficient documentation

## 2015-06-21 DIAGNOSIS — Z833 Family history of diabetes mellitus: Secondary | ICD-10-CM

## 2015-06-21 DIAGNOSIS — R7989 Other specified abnormal findings of blood chemistry: Secondary | ICD-10-CM | POA: Diagnosis not present

## 2015-06-21 DIAGNOSIS — Z803 Family history of malignant neoplasm of breast: Secondary | ICD-10-CM | POA: Diagnosis not present

## 2015-06-21 DIAGNOSIS — Z79899 Other long term (current) drug therapy: Secondary | ICD-10-CM | POA: Diagnosis not present

## 2015-06-21 LAB — CBC WITH DIFFERENTIAL/PLATELET
BASOS PCT: 1 %
Basophils Absolute: 0 10*3/uL (ref 0–0.1)
EOS ABS: 0.1 10*3/uL (ref 0–0.7)
EOS PCT: 2 %
HCT: 42.8 % (ref 35.0–47.0)
HEMOGLOBIN: 14.6 g/dL (ref 12.0–16.0)
Lymphocytes Relative: 39 %
Lymphs Abs: 1.8 10*3/uL (ref 1.0–3.6)
MCH: 30.8 pg (ref 26.0–34.0)
MCHC: 34.1 g/dL (ref 32.0–36.0)
MCV: 90.1 fL (ref 80.0–100.0)
Monocytes Absolute: 0.4 10*3/uL (ref 0.2–0.9)
Monocytes Relative: 9 %
NEUTROS PCT: 49 %
Neutro Abs: 2.3 10*3/uL (ref 1.4–6.5)
PLATELETS: 220 10*3/uL (ref 150–440)
RBC: 4.75 MIL/uL (ref 3.80–5.20)
RDW: 12.5 % (ref 11.5–14.5)
WBC: 4.7 10*3/uL (ref 3.6–11.0)

## 2015-06-21 LAB — COMPREHENSIVE METABOLIC PANEL
ALBUMIN: 4.8 g/dL (ref 3.5–5.0)
ALK PHOS: 46 U/L (ref 38–126)
ALT: 15 U/L (ref 14–54)
ANION GAP: 9 (ref 5–15)
AST: 20 U/L (ref 15–41)
BUN: 18 mg/dL (ref 6–20)
CALCIUM: 9.7 mg/dL (ref 8.9–10.3)
CHLORIDE: 103 mmol/L (ref 101–111)
CO2: 29 mmol/L (ref 22–32)
Creatinine, Ser: 0.57 mg/dL (ref 0.44–1.00)
GFR calc Af Amer: >60 mL/min (ref >60)
GFR calc non Af Amer: >60 mL/min (ref >60)
GLUCOSE: 88 mg/dL (ref 65–99)
Potassium: 4 mmol/L (ref 3.5–5.1)
SODIUM: 141 mmol/L (ref 135–145)
Total Bilirubin: 0.8 mg/dL (ref 0.3–1.2)
Total Protein: 7.4 g/dL (ref 6.5–8.1)

## 2015-06-21 LAB — SEDIMENTATION RATE: SED RATE: 7 mm/h (ref 0–30)

## 2015-06-21 LAB — FERRITIN: Ferritin: 147 ng/mL (ref 11–307)

## 2015-06-21 NOTE — Progress Notes (Signed)
[place] @ [facility] [phone]  Fax:([phone]) [employer] OB: 1960/10/03  MR#: [id number]  LKG#:[id number]  Patient Care Team: Arnetha Courser, MD as PCP - General (Family Medicine)  CHIEF COMPLAINT:  Chief Complaint  Patient presents with  . high ferritin     No history exists.    No flowsheet data found.  HISTORY OF PRESENT ILLNESS:   Vickie Drake is an otherwise healthy 55 year old Caucasian female, who is referred for evaluation of elevated ferritin. She fell from the course and December 2016, and injured her left side of the pelvis. She has been complaining of the pain in the left hip since then, but the pain has been improving. She did not take any pain medication for any extended period of time. She was validated by her primary care physician, who ordered ferritin levels, which returned elevated. Repeat ferritin levels also were elevated. She denies fevers, chills, rash, shortness of breath, cough, joint swelling or redness, weight loss, night sweats.  REVIEW OF SYSTEMS:   Review of Systems  All other systems reviewed and are negative.    PAST MEDICAL HISTORY: Past Medical History  Diagnosis Date  . Gross hematuria   . Hypothyroidism   . Fatigue   . Vitamin B12 deficiency   . History of panic attacks   . Fall from horse 11/2012    left leg injury    PAST SURGICAL HISTORY: Past Surgical History  Procedure Laterality Date  . Abdominal hysterectomy    . Breast biopsy    . Breast lumpectomy      FAMILY HISTORY Family History  Problem Relation Age of Onset  . Diabetes Mother   . Cancer Mother     breast  . Heart disease Father   . Heart attack Father   . Hypertension Father   . Diabetes Sister   . Diabetes Brother    there is no family history of early liver cirrhosis, early heart failure, liver cancer. Brother and sister have type 2 diabetes, mother has type 1 diabetes  ADVANCED DIRECTIVES:  No flowsheet data  found.  HEALTH MAINTENANCE: Social History  Substance Use Topics  . Smoking status: Former Research scientist (life sciences)  . Smokeless tobacco: Never Used  . Alcohol Use: 8.4 oz/week    14 Glasses of wine per week     Comment: wine only     Allergies  Allergen Reactions  . Cephalexin Other (See Comments)  . Tetracycline Other (See Comments)    Current Outpatient Prescriptions  Medication Sig Dispense Refill  . BIOTIN PO Take by mouth daily.    . Cholecalciferol (VITAMIN D PO) Take by mouth daily.    . cyclobenzaprine (FLEXERIL) 5 MG tablet Take 1 tablet (5 mg total) by mouth every 8 (eight) hours as needed for muscle spasms. 30 tablet 1  . levothyroxine (SYNTHROID, LEVOTHROID) 25 MCG tablet Take 25 mcg by mouth daily before breakfast. Reported on 06/07/2015    . Multiple Vitamin (MULTIVITAMIN) capsule Take by mouth daily.    . Omega-3 Fatty Acids (FISH OIL PO) Take by mouth daily.    Marland Kitchen omeprazole (PRILOSEC) 40 MG capsule Take 40 mg by mouth daily. Reported on 06/07/2015    . vitamin B-12 (CYANOCOBALAMIN) 500 MCG tablet Take 500 mcg by mouth daily.    . vitamin E 100 UNIT capsule Take by mouth daily.     No current facility-administered medications for this visit.    OBJECTIVE:  There were no vitals filed  for this visit.   There is no weight on file to calculate BMI.    ECOG FS:1 - Symptomatic but completely ambulatory  Physical Exam  Constitutional: She is oriented to person, place, and time and well-developed, well-nourished, and in no distress. No distress.  Well appearing Caucasian female.  HENT:  Head: Normocephalic and atraumatic.  Right Ear: External ear normal.  Left Ear: External ear normal.  Mouth/Throat: Oropharynx is clear and moist.  Eyes: Conjunctivae are normal. Pupils are equal, round, and reactive to light. Right eye exhibits no discharge. Left eye exhibits no discharge. No scleral icterus.  Neck: Normal range of motion. Neck supple. No JVD present. No tracheal deviation present. No  thyromegaly present.  Cardiovascular: Normal rate, regular rhythm, normal heart sounds and intact distal pulses.  Exam reveals no gallop and no friction rub.   No murmur heard. Pulmonary/Chest: Effort normal and breath sounds normal. No stridor. No respiratory distress. She has no wheezes. She has no rales. She exhibits no tenderness.  Abdominal: Soft. Bowel sounds are normal. She exhibits no distension and no mass. There is no tenderness. There is no rebound and no guarding.  Genitourinary:  Postponed  Musculoskeletal: Normal range of motion. She exhibits no edema or tenderness.  Lymphadenopathy:    She has no cervical adenopathy.  Neurological: She is alert and oriented to person, place, and time. She has normal reflexes. No cranial nerve deficit. She exhibits normal muscle tone. Gait normal. Coordination normal. GCS score is 15.  Skin: Skin is warm. No rash noted. She is not diaphoretic. No erythema. No pallor.  Psychiatric: Mood, memory, affect and judgment normal.  Vitals reviewed.    LAB RESULTS:  CBC Latest Ref Rng 06/07/2015 02/06/2015  WBC 3.4 - 10.8 x10E3/uL 3.4 3.6  Hematocrit 34.0 - 46.6 % 39.6 40.1  Platelets 150 - 379 x10E3/uL 217 196    No visits with results within 5 Day(s) from this visit. Latest known visit with results is:  Office Visit on 06/07/2015  Component Date Value Ref Range Status  . WBC 06/07/2015 3.4  3.4 - 10.8 x10E3/uL Final  . RBC 06/07/2015 4.38  3.77 - 5.28 x10E6/uL Final  . Hemoglobin 06/07/2015 13.9  11.1 - 15.9 g/dL Final  . Hematocrit 06/07/2015 39.6  34.0 - 46.6 % Final  . MCV 06/07/2015 90  79 - 97 fL Final  . MCH 06/07/2015 31.7  26.6 - 33.0 pg Final  . MCHC 06/07/2015 35.1  31.5 - 35.7 g/dL Final  . RDW 06/07/2015 12.3  12.3 - 15.4 % Final  . Platelets 06/07/2015 217  150 - 379 x10E3/uL Final  . Neutrophils 06/07/2015 47   Final  . Lymphs 06/07/2015 42   Final  . MID 06/07/2015 11   Final  . Neutrophils Absolute 06/07/2015 1.6  1.4 - 7.0  x10E3/uL Final  . Lymphocytes Absolute 06/07/2015 1.4  0.7 - 3.1 x10E3/uL Final  . MID (Absolute) 06/07/2015 0.4  0.1 - 1.6 X10E3/uL Final  . Ferritin 06/07/2015 218* 15 - 150 ng/mL Final  . Hep C Virus Ab 06/07/2015 <0.1  0.0 - 0.9 s/co ratio Final   Comment:                                   Negative:     < 0.8  Indeterminate: 0.8 - 0.9                                   Positive:     > 0.9  The CDC recommends that a positive HCV antibody result  be followed up with a HCV Nucleic Acid Amplification  test (366294).   Marland Kitchen HIV Screen 4th Generation wRfx 06/07/2015 Non Reactive  Non Reactive Final     STUDIES: No results found.  ASSESSMENT and MEDICAL DECISION MAKING:  Elevated ferritin-there are multiple reasons for elevated ferritin in this case, from hereditary hemachromatosis to inflammatory condition to alcohol related. We will perform genetic testing for hemochromatosis, recheck ferritin and CRP/ESR levels, along with liver function tests. We will consider performing ultrasound of the liver. She will return to our clinic in 3 weeks.  Patient expressed understanding and was in agreement with this plan. She also understands that She can call clinic at any time with any questions, concerns, or complaints.    No matching staging information was found for the patient.  Roxana Hires, MD   06/21/2015 3:13 PM

## 2015-06-21 NOTE — Addendum Note (Signed)
Addended by: Luella Cook on: 06/21/2015 04:15 PM   Modules accepted: Orders, Medications

## 2015-06-21 NOTE — Progress Notes (Signed)
Pt here for high ferritin but not sure why. She had went to the doctor for muscle pull from riding a horse and thought the pain was so bad that maybe it could be infection in the bone.  The pain has resolved but repeat ferritin was abnl so pt was seen here.

## 2015-06-22 LAB — C-REACTIVE PROTEIN

## 2015-06-27 LAB — HEMOCHROMATOSIS DNA-PCR(C282Y,H63D)

## 2015-06-29 NOTE — Telephone Encounter (Signed)
Message sent to urologist

## 2015-07-01 DIAGNOSIS — R8289 Other abnormal findings on cytological and histological examination of urine: Secondary | ICD-10-CM | POA: Insufficient documentation

## 2015-07-01 NOTE — Assessment & Plan Note (Signed)
She did not complete work-up; refer to urologist

## 2015-07-01 NOTE — Telephone Encounter (Signed)
I spoke with patient; refer to urologist; referral entered to Dr. Pilar Jarvis

## 2015-07-12 ENCOUNTER — Encounter: Payer: Self-pay | Admitting: Internal Medicine

## 2015-07-12 ENCOUNTER — Inpatient Hospital Stay: Payer: BLUE CROSS/BLUE SHIELD | Attending: Internal Medicine | Admitting: Internal Medicine

## 2015-07-12 VITALS — BP 119/84 | HR 71 | Temp 97.3°F | Resp 18 | Ht 65.5 in | Wt 150.4 lb

## 2015-07-12 DIAGNOSIS — Z87891 Personal history of nicotine dependence: Secondary | ICD-10-CM

## 2015-07-12 DIAGNOSIS — M25552 Pain in left hip: Secondary | ICD-10-CM | POA: Diagnosis not present

## 2015-07-12 DIAGNOSIS — R7989 Other specified abnormal findings of blood chemistry: Secondary | ICD-10-CM | POA: Insufficient documentation

## 2015-07-12 DIAGNOSIS — Z833 Family history of diabetes mellitus: Secondary | ICD-10-CM | POA: Insufficient documentation

## 2015-07-12 DIAGNOSIS — Z79899 Other long term (current) drug therapy: Secondary | ICD-10-CM | POA: Diagnosis not present

## 2015-07-12 NOTE — Progress Notes (Signed)
[place] @ [facility] [phone]) [phone]  Fax:([phone]) [place]: 1960-12-13  MR#: [id number]  WER#:[id number]  Patient Care Team: Arnetha Courser, MD as PCP - General (Family Medicine)  CHIEF COMPLAINT:  No chief complaint on file.    No history exists.    No flowsheet data found.  HISTORY OF PRESENT ILLNESS:   Vickie Drake is an otherwise healthy 55 year old Caucasian female, who is referred for evaluation of elevated ferritin. She fell from the course and December 2016, and injured her left side of the pelvis. She has been complaining of the pain in the left hip since then, but the pain has been improving. She did not take any pain medication for any extended period of time. She was validated by her primary care physician, who ordered ferritin levels, which returned elevated. Repeat ferritin levels also were elevated.    Current status: Patient returns to our clinic for a follow-up visit to discuss the results of the workup. She has no complaints this point and feels well overall. She does not have any hip pain anymore.  She denies fevers, chills, rash, shortness of breath, cough, joint swelling or redness, weight loss, night sweats.    REVIEW OF SYSTEMS:   Review of Systems  All other systems reviewed and are negative.    PAST MEDICAL HISTORY: Past Medical History  Diagnosis Date  . Gross hematuria   . Hypothyroidism   . Fatigue   . Vitamin B12 deficiency   . History of panic attacks   . Fall from horse 11/2012    left leg injury    PAST SURGICAL HISTORY: Past Surgical History  Procedure Laterality Date  . Abdominal hysterectomy    . Breast biopsy    . Breast lumpectomy    . Appendectomy      FAMILY HISTORY Family History  Problem Relation Age of Onset  . Diabetes Mother   . Cancer Mother     breast  . Heart disease Father   . Heart attack Father   . Hypertension Father   . Diabetes Sister   . Diabetes Brother    there is  no family history of early liver cirrhosis, early heart failure, liver cancer. Brother and sister have type 2 diabetes, mother has type 1 diabetes  ADVANCED DIRECTIVES:  No flowsheet data found.  HEALTH MAINTENANCE: Social History  Substance Use Topics  . Smoking status: Former Research scientist (life sciences)  . Smokeless tobacco: Never Used  . Alcohol Use: 8.4 oz/week    14 Glasses of wine per week     Comment: wine only     Allergies  Allergen Reactions  . Cephalexin Other (See Comments)  . Tetracycline Other (See Comments)    Current Outpatient Prescriptions  Medication Sig Dispense Refill  . BIOTIN PO Take by mouth daily.    . Cholecalciferol (VITAMIN D PO) Take by mouth daily.    . Multiple Vitamin (MULTIVITAMIN) capsule Take by mouth daily.    . vitamin B-12 (CYANOCOBALAMIN) 500 MCG tablet Take 500 mcg by mouth daily.     No current facility-administered medications for this visit.    OBJECTIVE:  Filed Vitals:   07/12/15 0933  BP: 119/84  Pulse: 71  Temp: 97.3 F (36.3 C)  Resp: 18     Body mass index is 24.63 kg/(m^2).    ECOG FS:0 - Asymptomatic  Physical Exam  Constitutional: She is oriented to person, place, and time and well-developed, well-nourished, and in no  distress. No distress.  Well appearing Caucasian female.  HENT:  Head: Normocephalic and atraumatic.  Right Ear: External ear normal.  Left Ear: External ear normal.  Mouth/Throat: Oropharynx is clear and moist.  Eyes: Conjunctivae are normal. Pupils are equal, round, and reactive to light. Right eye exhibits no discharge. Left eye exhibits no discharge. No scleral icterus.  Neck: Normal range of motion. Neck supple. No JVD present. No tracheal deviation present. No thyromegaly present.  Cardiovascular: Normal rate, regular rhythm, normal heart sounds and intact distal pulses.  Exam reveals no gallop and no friction rub.   No murmur heard. Pulmonary/Chest: Effort normal and breath sounds normal. No stridor. No  respiratory distress. She has no wheezes. She has no rales. She exhibits no tenderness.  Abdominal: Soft. Bowel sounds are normal. She exhibits no distension and no mass. There is no tenderness. There is no rebound and no guarding.  Genitourinary:  Postponed  Musculoskeletal: Normal range of motion. She exhibits no edema or tenderness.  Lymphadenopathy:    She has no cervical adenopathy.  Neurological: She is alert and oriented to person, place, and time. She has normal reflexes. No cranial nerve deficit. She exhibits normal muscle tone. Gait normal. Coordination normal. GCS score is 15.  Skin: Skin is warm. No rash noted. She is not diaphoretic. No erythema. No pallor.  Psychiatric: Mood, memory, affect and judgment normal.  Vitals reviewed.    LAB RESULTS:  CBC Latest Ref Rng 06/21/2015 06/07/2015  WBC 3.6 - 11.0 K/uL 4.7 3.4  Hemoglobin 12.0 - 16.0 g/dL 14.6 -  Hematocrit 35.0 - 47.0 % 42.8 39.6  Platelets 150 - 440 K/uL 220 217    No visits with results within 5 Day(s) from this visit. Latest known visit with results is:  Appointment on 06/21/2015  Component Date Value Ref Range Status  . DNA Mutation Analysis 06/21/2015 Comment   Final   Comment: (NOTE) NO MUTATION IDENTIFIED Interpretation: This patient's sample was analyzed for the hereditary hemochromatosis (HH) mutations C282Y, H63D, S65C. No mutation was identified. The mutations analyzed by [facility] are most common in the Caucasian population, and up to 90% of affected Caucasians will have a positive test result. Because this panel does not identify rare HH mutations or HH mutations found in other ethnic groups, there are a small number of people who may have a negative test but may actually be affected. The diagnosis of HH should include clinical findings and other test results such as transferrin-iron saturation and/or serum ferritin studies and/or liver biopsy.  If this patient has a history of HH, in many cases  a specific carrier risk can be determined based on this negative result. Methodology: DNA Analysis of the HFE gene was performed by PCR amplification followed by restriction enzyme digestion analyses. Reference: Cathe Mons and Walker AP. (20                          00). Genet Test 4:97-101. Cogswell ME et al. (1999). AM J Prev Med 16:134-140. Bomford A (2002). Lancet 360(9346):1673-81. Stan Head et al. (2002). Blood Cells, Molecules. and Diseases.  29(3):418-432. Imperatore G et al. (2003). Genet Med. 5(1):1-8. Cogswell ME et al. (2003). Genet Med. 5(4):304-10. Genetic counselors are available for health care providers to discuss results at [phone]. Allison Quarry, PhD, [facility] Ruben Reason, PhD, [facility] Jens Som, PhD, [facility] Annetta Maw, M.S., PhD, [facility] Alfredo Bach, PhD, [facility] Norva Riffle, PhD, [facility] Earlean Polka,  PhD, [facility] Performed At: [facility] [place], [place] [id number] Nechama Guard MD UX:[id number]   . CRP 06/21/2015 <0.5  <1.0 mg/dL Final   Performed at [facility]  . Sed Rate 06/21/2015 7  0 - 30 mm/hr Final  . WBC 06/21/2015 4.7  3.6 - 11.0 K/uL Final  . RBC 06/21/2015 4.75  3.80 - 5.20 MIL/uL Final  . Hemoglobin 06/21/2015 14.6  12.0 - 16.0 g/dL Final  . HCT 06/21/2015 42.8  35.0 - 47.0 % Final  . MCV 06/21/2015 90.1  80.0 - 100.0 fL Final  . MCH 06/21/2015 30.8  26.0 - 34.0 pg Final  . MCHC 06/21/2015 34.1  32.0 - 36.0 g/dL Final  . RDW 06/21/2015 12.5  11.5 - 14.5 % Final  . Platelets 06/21/2015 220  150 - 440 K/uL Final  . Neutrophils Relative % 06/21/2015 49   Final  . Neutro Abs 06/21/2015 2.3  1.4 - 6.5 K/uL Final  . Lymphocytes Relative 06/21/2015 39   Final  . Lymphs Abs 06/21/2015 1.8  1.0 - 3.6 K/uL Final  . Monocytes Relative 06/21/2015 9   Final  . Monocytes Absolute 06/21/2015 0.4  0.2 - 0.9 K/uL Final  . Eosinophils Relative 06/21/2015 2   Final  . Eosinophils Absolute  06/21/2015 0.1  0 - 0.7 K/uL Final  . Basophils Relative 06/21/2015 1   Final  . Basophils Absolute 06/21/2015 0.0  0 - 0.1 K/uL Final  . Sodium 06/21/2015 141  135 - 145 mmol/L Final  . Potassium 06/21/2015 4.0  3.5 - 5.1 mmol/L Final  . Chloride 06/21/2015 103  101 - 111 mmol/L Final  . CO2 06/21/2015 29  22 - 32 mmol/L Final  . Glucose, Bld 06/21/2015 88  65 - 99 mg/dL Final  . BUN 06/21/2015 18  6 - 20 mg/dL Final  . Creatinine, Ser 06/21/2015 0.57  0.44 - 1.00 mg/dL Final  . Calcium 06/21/2015 9.7  8.9 - 10.3 mg/dL Final  . Total Protein 06/21/2015 7.4  6.5 - 8.1 g/dL Final  . Albumin 06/21/2015 4.8  3.5 - 5.0 g/dL Final  . AST 06/21/2015 20  15 - 41 U/L Final  . ALT 06/21/2015 15  14 - 54 U/L Final  . Alkaline Phosphatase 06/21/2015 46  38 - 126 U/L Final  . Total Bilirubin 06/21/2015 0.8  0.3 - 1.2 mg/dL Final  . GFR calc non Af Amer 06/21/2015 >60  >60 mL/min Final  . GFR calc Af Amer 06/21/2015 >60  >60 mL/min Final   Comment: (NOTE) The eGFR has been calculated using the CKD EPI equation. This calculation has not been validated in all clinical situations. eGFR's persistently <60 mL/min signify possible Chronic Kidney Disease.   . Anion gap 06/21/2015 9  5 - 15 Final  . Ferritin 06/21/2015 147  11 - 307 ng/mL Final     STUDIES: No results found.  ASSESSMENT and MEDICAL DECISION MAKING:  Elevated ferritin-likely secondary to inflammation (?). At the time of repeat testing ferritin levels returned to normal, there were no signs of inflammation with normal ESR and CRP as well as normal physical exam. Genetic testing returned negative for hereditary hemachromatosis, liver function tests are entirely within normal limits. There is no evidence of iron overload or a condition which would predispose to iron overload. The patient can have another ferritin level checked in 6 months with a primary care physician, but no need for hematology follow-up at this time, however, can  be  referred later if ferritin continues to be elevated. The patient inquired about donating blood, and from my point of view there are no contraindications to do so.  Patient expressed understanding and was in agreement with this plan. She also understands that She can call clinic at any time with any questions, concerns, or complaints.    No matching staging information was found for the patient.  Roxana Hires, MD   07/12/2015 9:27 AM

## 2015-07-12 NOTE — Progress Notes (Signed)
Pt here today to get her results of the lab workup done on first day to determine if we cn find a cause for the elevated ferritin. No problems today

## 2015-07-24 ENCOUNTER — Telehealth: Payer: Self-pay | Admitting: Family Medicine

## 2015-07-24 ENCOUNTER — Other Ambulatory Visit: Payer: BLUE CROSS/BLUE SHIELD

## 2015-07-24 DIAGNOSIS — R3129 Other microscopic hematuria: Secondary | ICD-10-CM

## 2015-07-24 LAB — UA/M W/RFLX CULTURE, ROUTINE
BILIRUBIN UA: NEGATIVE
Glucose, UA: NEGATIVE
KETONES UA: NEGATIVE
Leukocytes, UA: NEGATIVE
NITRITE UA: NEGATIVE
PH UA: 6 (ref 5.0–7.5)
Protein, UA: NEGATIVE
RBC UA: NEGATIVE
SPEC GRAV UA: 1.01 (ref 1.005–1.030)
UUROB: 0.2 mg/dL (ref 0.2–1.0)

## 2015-07-24 NOTE — Assessment & Plan Note (Signed)
Recheck urine, referred to urologist

## 2015-07-24 NOTE — Telephone Encounter (Signed)
I put in a referral to the urologist on January 30th; patient has not heard anything about this appointment I told her that I am very disappointed that she has not heard anything, and will address this with my office manager today No weight loss, no blood in the urine visibly, but has a little dysuria and chills She just went to the GYN office and had a little blood in her urine there Mondays are best for referral to urology I told her that I will personally call and make the appt for the urologist, apologized again on behalf of my staff and the system that she has not heard anything for over 3 weeks since I made the referral She may come in today for a repeat urinalysis on the lab schedule --------------------------- I personally called [facility]; she said someone typed in [place] on the referral, but I explained I specifically typed Dr. Pilar Jarvis  She is now scheduled for an appt with Dr. Junious Silk, Monday at 9:30 am, Feb 27th; arrive 9:10 am; Amy will call the patient and let her know about the appointment personally --------------------------- Alwyn Ren, Can you please follow-up on this? Please call patient to make sure all is settled and we correct any processes Thank you, Dr. Sanda Klein

## 2015-07-24 NOTE — Telephone Encounter (Signed)
Pt called would like a call back from Dr. Sanda Klein. Pt mentioned something about a Urology appointment. Please call her ASAP. Thanks.

## 2015-07-24 NOTE — Telephone Encounter (Signed)
Urine is completely normal; good news I called patient, explained urine findings She knows about her urology appointment, next Monday Call me if there is anything else I can do, or any new symptoms develop

## 2015-07-29 ENCOUNTER — Ambulatory Visit (INDEPENDENT_AMBULATORY_CARE_PROVIDER_SITE_OTHER): Payer: BLUE CROSS/BLUE SHIELD | Admitting: Urology

## 2015-07-29 ENCOUNTER — Encounter: Payer: Self-pay | Admitting: Urology

## 2015-07-29 VITALS — BP 121/85 | HR 73 | Ht 66.0 in | Wt 147.8 lb

## 2015-07-29 DIAGNOSIS — R310 Gross hematuria: Secondary | ICD-10-CM

## 2015-07-29 DIAGNOSIS — R8289 Other abnormal findings on cytological and histological examination of urine: Secondary | ICD-10-CM

## 2015-07-29 DIAGNOSIS — R828 Abnormal findings on cytological and histological examination of urine: Secondary | ICD-10-CM | POA: Diagnosis not present

## 2015-07-29 LAB — URINALYSIS, COMPLETE
Bilirubin, UA: NEGATIVE
GLUCOSE, UA: NEGATIVE
Ketones, UA: NEGATIVE
LEUKOCYTES UA: NEGATIVE
Nitrite, UA: NEGATIVE
PROTEIN UA: NEGATIVE
Specific Gravity, UA: 1.015 (ref 1.005–1.030)
UUROB: 0.2 mg/dL (ref 0.2–1.0)
pH, UA: 6.5 (ref 5.0–7.5)

## 2015-07-29 LAB — MICROSCOPIC EXAMINATION: Bacteria, UA: NONE SEEN

## 2015-07-29 NOTE — Progress Notes (Signed)
Consultation: Gross hematuria Referred by: Dr. Sanda Klein, PCP Dr. Jeananne Rama  History of present illness: A 55 year old with a history of voiding red urine especially in the mornings for the past 2 years. It's intermittent and only happens every few weeks. She's had no clots. She set a negative urinalysis 3. She thought she has CT scan but I did not appreciate any upper tract imaging. I looked on Epic and care everywhere. She saw Dr. Redmond Pulling the past to recommended a cystoscopy but she did not follow-up. She had a urine cytology which looked normal. There were no malignant cells. There are no associated signs or symptoms or aggravating or alleviating factors. She does have some occasional frequency nocturia and incontinence.  Patient is a remote smoker for 5 years as a teenager. She's had no chemotherapy or radiation exposure. 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  She has some back pain which she gets after riding horses.  Genuine a hysterectomy in 2000.  Her past medical, surgical history, family history, allergies, medications were reviewed. Her social and family history was reviewed.  Review of systems, 13 system was obtained and was normal apart from the following: Night sweats  Physical exam: Patient in no acute distress. There are no focal neurologic deficits.  Assessment/plan: Gross hematuria - we discussed the nature of gross hematuria. We discussed the nature risks and benefits of CT scan with IV contrast and cystoscopy and she elected to proceed. I thought her cytology looked normal. I tried to delete abnormal cytology from her diagnoses but I could not.

## 2015-08-08 ENCOUNTER — Ambulatory Visit: Admission: RE | Admit: 2015-08-08 | Payer: BLUE CROSS/BLUE SHIELD | Source: Ambulatory Visit

## 2015-08-12 ENCOUNTER — Ambulatory Visit (INDEPENDENT_AMBULATORY_CARE_PROVIDER_SITE_OTHER): Payer: BLUE CROSS/BLUE SHIELD | Admitting: Urology

## 2015-08-12 ENCOUNTER — Encounter: Payer: Self-pay | Admitting: Urology

## 2015-08-12 VITALS — BP 117/84 | HR 75 | Ht 66.0 in | Wt 149.4 lb

## 2015-08-12 DIAGNOSIS — R899 Unspecified abnormal finding in specimens from other organs, systems and tissues: Secondary | ICD-10-CM | POA: Diagnosis not present

## 2015-08-12 DIAGNOSIS — R310 Gross hematuria: Secondary | ICD-10-CM

## 2015-08-12 DIAGNOSIS — R896 Abnormal cytological findings in specimens from other organs, systems and tissues: Secondary | ICD-10-CM

## 2015-08-12 LAB — URINALYSIS, COMPLETE
Bilirubin, UA: NEGATIVE
GLUCOSE, UA: NEGATIVE
KETONES UA: NEGATIVE
LEUKOCYTES UA: NEGATIVE
Nitrite, UA: NEGATIVE
Protein, UA: NEGATIVE
RBC, UA: NEGATIVE
Urobilinogen, Ur: 0.2 mg/dL (ref 0.2–1.0)
pH, UA: 5 (ref 5.0–7.5)

## 2015-08-12 LAB — MICROSCOPIC EXAMINATION
Bacteria, UA: NONE SEEN
RBC, UA: NONE SEEN /hpf (ref 0–?)

## 2015-08-12 MED ORDER — LIDOCAINE HCL 2 % EX GEL
1.0000 "application " | Freq: Once | CUTANEOUS | Status: AC
  Administered 2015-08-12: 1 via URETHRAL

## 2015-08-12 MED ORDER — CIPROFLOXACIN HCL 500 MG PO TABS
500.0000 mg | ORAL_TABLET | Freq: Once | ORAL | Status: AC
  Administered 2015-08-12: 500 mg via ORAL

## 2015-08-12 NOTE — Procedures (Signed)
    Cystoscopy Procedure Note  Patient identification was confirmed, informed consent was obtained, and patient was prepped using Betadine solution.  Lidocaine jelly was administered per urethral meatus.    Preoperative abx where received prior to procedure.    Procedure: - Flexible cystoscope introduced, without any difficulty.   - Thorough search of the bladder revealed:    normal urethral meatus    normal urothelium    no stones    no ulcers     no tumors    no urethral polyps    no trabeculation  - Ureteral orifices were normal in position and appearance.  Post-Procedure: - Patient tolerated the procedure well   

## 2015-10-09 ENCOUNTER — Other Ambulatory Visit: Payer: Self-pay | Admitting: Gynecology

## 2015-10-09 ENCOUNTER — Other Ambulatory Visit: Payer: Self-pay | Admitting: Hematology and Oncology

## 2015-10-09 DIAGNOSIS — R921 Mammographic calcification found on diagnostic imaging of breast: Secondary | ICD-10-CM

## 2016-01-21 ENCOUNTER — Ambulatory Visit: Payer: BLUE CROSS/BLUE SHIELD | Admitting: Family Medicine

## 2016-01-28 ENCOUNTER — Ambulatory Visit (INDEPENDENT_AMBULATORY_CARE_PROVIDER_SITE_OTHER): Payer: BLUE CROSS/BLUE SHIELD | Admitting: Family Medicine

## 2016-01-28 ENCOUNTER — Encounter: Payer: Self-pay | Admitting: Family Medicine

## 2016-01-28 DIAGNOSIS — R7989 Other specified abnormal findings of blood chemistry: Secondary | ICD-10-CM

## 2016-01-28 DIAGNOSIS — E559 Vitamin D deficiency, unspecified: Secondary | ICD-10-CM

## 2016-01-28 DIAGNOSIS — R739 Hyperglycemia, unspecified: Secondary | ICD-10-CM | POA: Diagnosis not present

## 2016-01-28 DIAGNOSIS — R3129 Other microscopic hematuria: Secondary | ICD-10-CM | POA: Diagnosis not present

## 2016-01-28 DIAGNOSIS — E538 Deficiency of other specified B group vitamins: Secondary | ICD-10-CM

## 2016-01-28 DIAGNOSIS — Z0000 Encounter for general adult medical examination without abnormal findings: Secondary | ICD-10-CM

## 2016-01-28 DIAGNOSIS — E039 Hypothyroidism, unspecified: Secondary | ICD-10-CM | POA: Diagnosis not present

## 2016-01-28 LAB — LIPID PANEL
Cholesterol: 205 mg/dL — ABNORMAL HIGH (ref 125–200)
HDL: 91 mg/dL (ref >=46)
LDL Cholesterol: 100 mg/dL (ref <130)
Total CHOL/HDL Ratio: 2.3 Ratio (ref <=5.0)
Triglycerides: 69 mg/dL (ref <150)
VLDL: 14 mg/dL (ref <30)

## 2016-01-28 LAB — CBC WITH DIFFERENTIAL/PLATELET
Basophils Absolute: 52 cells/uL (ref 0–200)
Basophils Relative: 1 %
EOS PCT: 3 %
Eosinophils Absolute: 156 cells/uL (ref 15–500)
HCT: 40.9 % (ref 35.0–45.0)
HEMOGLOBIN: 14 g/dL (ref 11.7–15.5)
LYMPHS ABS: 1768 {cells}/uL (ref 850–3900)
Lymphocytes Relative: 34 %
MCH: 30.7 pg (ref 27.0–33.0)
MCHC: 34.2 g/dL (ref 32.0–36.0)
MCV: 89.7 fL (ref 80.0–100.0)
MPV: 9.5 fL (ref 7.5–12.5)
Monocytes Absolute: 520 cells/uL (ref 200–950)
Monocytes Relative: 10 %
NEUTROS ABS: 2704 {cells}/uL (ref 1500–7800)
NEUTROS PCT: 52 %
Platelets: 197 10*3/uL (ref 140–400)
RBC: 4.56 MIL/uL (ref 3.80–5.10)
RDW: 12.4 % (ref 11.0–15.0)
WBC: 5.2 10*3/uL (ref 3.8–10.8)

## 2016-01-28 LAB — COMPLETE METABOLIC PANEL WITH GFR
ALBUMIN: 4.7 g/dL (ref 3.6–5.1)
ALT: 13 U/L (ref 6–29)
AST: 19 U/L (ref 10–35)
Alkaline Phosphatase: 43 U/L (ref 33–130)
BILIRUBIN TOTAL: 0.7 mg/dL (ref 0.2–1.2)
BUN: 14 mg/dL (ref 7–25)
CO2: 27 mmol/L (ref 20–31)
Calcium: 9.7 mg/dL (ref 8.6–10.4)
Chloride: 105 mmol/L (ref 98–110)
Creat: 0.68 mg/dL (ref 0.50–1.05)
GFR, Est African American: >89 mL/min (ref >=60)
GLUCOSE: 108 mg/dL — AB (ref 65–99)
Potassium: 4.2 mmol/L (ref 3.5–5.3)
SODIUM: 141 mmol/L (ref 135–146)
TOTAL PROTEIN: 7 g/dL (ref 6.1–8.1)

## 2016-01-28 LAB — HEMOGLOBIN A1C
HEMOGLOBIN A1C: 5 % (ref <5.7)
MEAN PLASMA GLUCOSE: 97 mg/dL

## 2016-01-28 NOTE — Assessment & Plan Note (Signed)
Checked out thoroughly by urologist, he said she had a spot in her kidney, due for f/u one year after left check

## 2016-01-28 NOTE — Assessment & Plan Note (Signed)
Check B12 level, taking supplements

## 2016-01-28 NOTE — Progress Notes (Signed)
BP (!) 108/58   Pulse 68   Temp 97.6 F (36.4 C) (Oral)   Wt 151 lb (68.5 kg)   SpO2 96%   BMI 24.37 kg/m    Subjective:    Patient ID: Vickie Drake, female    DOB: December 10, 1960, 55 y.o.   MRN: [id number]  HPI: Vickie Drake is a 55 y.o. female  Chief Complaint  Patient presents with  . Follow-up    She had high iron in her blood (elevated ferritin) and she saw the hematologist; not cooking on cast iron; well water, new house 6 months ago; not eating a lot of dark greens; does love broccoli; no HH (tested by hematologist); reviewed heme note in full from Feb 2017; she is here for repeat testing  She wants her thyroid checked; hair is brittle and thin and breaking off; gained a little bit of weight with eating; no constipation; energy level is okay  She is busy and taking care of her dear horses  Hx of low vit D, taking supplement plus she's outdoors some  Hx of B12 deficiency, using SL supplement  She does not want a flu shot today  Depression screen [facility] 2/9 01/28/2016  Decreased Interest 0  Down, Depressed, Hopeless 0  PHQ - 2 Score 0   Relevant past medical, surgical, family and social history reviewed Past Medical History:  Diagnosis Date  . Fall from horse 11/2012   left leg injury  . Fatigue   . Gross hematuria   . History of panic attacks   . Hypothyroidism   . Vitamin B12 deficiency    Past Surgical History:  Procedure Laterality Date  . ABDOMINAL HYSTERECTOMY    . APPENDECTOMY    . BREAST BIOPSY    . BREAST LUMPECTOMY     Family History  Problem Relation Age of Onset  . Diabetes Mother   . Cancer Mother     breast  . Heart disease Father   . Heart attack Father   . Hypertension Father   . Diabetes Sister   . Diabetes Brother    Social History  Substance Use Topics  . Smoking status: Former Research scientist (life sciences)  . Smokeless tobacco: Never Used  . Alcohol use 8.4 oz/week    14 Glasses of wine per week     Comment: wine only    Interim medical  history since last visit reviewed. Allergies and medications reviewed  Review of Systems Per HPI unless specifically indicated above     Objective:    BP (!) 108/58   Pulse 68   Temp 97.6 F (36.4 C) (Oral)   Wt 151 lb (68.5 kg)   SpO2 96%   BMI 24.37 kg/m   Wt Readings from Last 3 Encounters:  01/28/16 151 lb (68.5 kg)  08/12/15 149 lb 6.4 oz (67.8 kg)  07/29/15 147 lb 12.8 oz (67 kg)    Physical Exam  Constitutional: She appears well-developed and well-nourished.  HENT:  Mouth/Throat: Mucous membranes are normal.  Eyes: EOM are normal. No scleral icterus.  No proptosis or exophthalmos  Neck: No thyromegaly present.  Cardiovascular: Normal rate and regular rhythm.   Pulmonary/Chest: Effort normal and breath sounds normal.  Abdominal: She exhibits no distension.  Neurological:  Reflex Scores:      Patellar reflexes are 2+ on the right side and 2+ on the left side. No tics or tremors  Skin: No pallor.  No flushing  Psychiatric: She has a normal mood and affect.  Her behavior is normal. Her mood appears not anxious. She does not exhibit a depressed mood.  Very pleasant    Results for orders placed or performed in visit on 08/12/15  Microscopic Examination  Result Value Ref Range   WBC, UA 0-5 0 - 5 /hpf   RBC, UA None seen 0 - 2 /hpf   Epithelial Cells (non renal) 0-10 0 - 10 /hpf   Bacteria, UA None seen None seen/Few  Urinalysis, Complete  Result Value Ref Range   Specific Gravity, UA <1.005 (L) 1.005 - 1.030   pH, UA 5.0 5.0 - 7.5   Color, UA Yellow Yellow   Appearance Ur Clear Clear   Leukocytes, UA Negative Negative   Protein, UA Negative Negative/Trace   Glucose, UA Negative Negative   Ketones, UA Negative Negative   RBC, UA Negative Negative   Bilirubin, UA Negative Negative   Urobilinogen, Ur 0.2 0.2 - 1.0 mg/dL   Nitrite, UA Negative Negative   Microscopic Examination See below:       Assessment & Plan:   Problem List Items Addressed This  Visit      Digestive   Vitamin B12 deficiency    Check B12 level, taking supplements      Relevant Orders   Vitamin B12     Endocrine   Adult hypothyroidism    Check TSH and free T4        Genitourinary   Microscopic hematuria    Checked out thoroughly by urologist, he said she had a spot in her kidney, due for f/u one year after left check        Other   Preventative health care    Check lipids; GYN does well woman exams      Relevant Orders   Lipid panel   COMPLETE METABOLIC PANEL WITH GFR   Elevated ferritin level    Recheck level today      Relevant Orders   CBC with Differential/Platelet   Ferritin   Blood glucose elevated    Check glucose and A1c      Relevant Orders   Hemoglobin A1c   Avitaminosis D    Check today      Relevant Orders   VITAMIN D 25 Hydroxy (Vit-D Deficiency, Fractures)    Other Visit Diagnoses   None.     Follow up plan: Return in about 6 months (around 07/29/2016) for follow-up.  An after-visit summary was printed and given to the patient at [place].  Please see the patient instructions which may contain other information and recommendations beyond what is mentioned above in the assessment and plan.  Orders Placed This Encounter  Procedures  . CBC with Differential/Platelet  . Hemoglobin A1c  . Lipid panel  . COMPLETE METABOLIC PANEL WITH GFR  . Ferritin  . Vitamin B12  . VITAMIN D 25 Hydroxy (Vit-D Deficiency, Fractures)   Patient declined flu shot today

## 2016-01-28 NOTE — Assessment & Plan Note (Signed)
Check TSH and free T4 

## 2016-01-28 NOTE — Assessment & Plan Note (Signed)
Check today 

## 2016-01-28 NOTE — Assessment & Plan Note (Signed)
Recheck level today 

## 2016-01-28 NOTE — Assessment & Plan Note (Signed)
Check lipids; GYN does well woman exams

## 2016-01-28 NOTE — Assessment & Plan Note (Signed)
Check glucose and A1c 

## 2016-01-28 NOTE — Patient Instructions (Addendum)
We'll get labs today Try to limit saturated fats in your diet (bologna, hot dogs, barbeque, cheeseburgers, hamburgers, steak, bacon, sausage, cheese, etc.) and get more fresh fruits, vegetables, and whole grains

## 2016-01-29 LAB — VITAMIN B12: VITAMIN B 12: 446 pg/mL (ref 200–1100)

## 2016-01-29 LAB — FERRITIN: FERRITIN: 164 ng/mL (ref 10–232)

## 2016-01-29 LAB — VITAMIN D 25 HYDROXY (VIT D DEFICIENCY, FRACTURES): VIT D 25 HYDROXY: 41 ng/mL (ref 30–100)

## 2016-07-07 ENCOUNTER — Other Ambulatory Visit: Payer: Self-pay | Admitting: Family Medicine

## 2016-07-07 ENCOUNTER — Encounter: Payer: Self-pay | Admitting: Family Medicine

## 2016-07-07 ENCOUNTER — Ambulatory Visit (INDEPENDENT_AMBULATORY_CARE_PROVIDER_SITE_OTHER): Payer: BLUE CROSS/BLUE SHIELD | Admitting: Family Medicine

## 2016-07-07 VITALS — BP 126/84 | HR 98 | Temp 99.1°F | Resp 16 | Wt 152.2 lb

## 2016-07-07 DIAGNOSIS — R509 Fever, unspecified: Secondary | ICD-10-CM

## 2016-07-07 DIAGNOSIS — J101 Influenza due to other identified influenza virus with other respiratory manifestations: Secondary | ICD-10-CM

## 2016-07-07 LAB — POCT INFLUENZA A/B
INFLUENZA B, POC: POSITIVE — AB
Influenza A, POC: NEGATIVE

## 2016-07-07 MED ORDER — PROMETHAZINE HCL 25 MG PO TABS: 12.5000 mg | ORAL_TABLET | Freq: Four times a day (QID) | ORAL | 0 refills | Status: DC | PRN

## 2016-07-07 MED ORDER — OSELTAMIVIR PHOSPHATE 75 MG PO CAPS: 75.0000 mg | ORAL_CAPSULE | Freq: Two times a day (BID) | ORAL | 0 refills | Status: DC

## 2016-07-07 NOTE — Patient Instructions (Addendum)

## 2016-07-07 NOTE — Progress Notes (Signed)
BP 126/84   Pulse 98   Temp 99.1 F (37.3 C) (Oral)   Resp 16   Wt 152 lb 4 oz (69.1 kg)   SpO2 95%   BMI 24.57 kg/m    Subjective:    Patient ID: Vickie Drake, female    DOB: April 21, 1961, 55 y.o.   MRN: [id number]  HPI: Vickie Drake is a 56 y.o. female  Chief Complaint  Patient presents with  . Influenza    chest tightness, cough, ,mild fever, congestion.   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 Fall from horse 11/2012   left leg injury  . Fatigue   . Gross hematuria   . History of panic attacks   . Hypothyroidism   . Vitamin B12 deficiency    Family History  Problem Relation Age of Onset  . Diabetes Mother   . Cancer Mother     breast  . Heart disease Father   . Heart attack Father   . Hypertension Father   . Diabetes Sister   . Diabetes Brother    Social History  Substance Use Topics  . Smoking status: Former Research scientist (life sciences)  . Smokeless tobacco: Never Used  . Alcohol use 8.4 oz/week    14 Glasses of wine per week     Comment: wine only   Interim medical history since last visit reviewed. Allergies and medications reviewed  Review of Systems Per HPI unless specifically indicated above     Objective:    BP 126/84   Pulse 98   Temp 99.1 F (37.3 C) (Oral)   Resp 16   Wt 152 lb 4 oz (69.1 kg)   SpO2 95%   BMI 24.57 kg/m   Wt Readings from Last 3 Encounters:  07/07/16 152 lb 4 oz (69.1 kg)  01/28/16 151 lb (68.5 kg)  08/12/15 149 lb 6.4 oz (67.8 kg)    Physical Exam    Constitutional: She appears well-developed and well-nourished. No distress (appears to not feel well but nontoxic).  HENT:  Head: Normocephalic and atraumatic.  Right Ear: External ear normal.  Left Ear: External ear normal.  Nose: Nose normal.  Mouth/Throat: Oropharynx is clear and moist and mucous membranes are normal. No oropharyngeal exudate.  Eyes: EOM are normal. No scleral icterus.  Cardiovascular: Normal rate and regular rhythm.   Pulmonary/Chest: Effort normal and breath sounds normal. No accessory muscle usage. No respiratory distress. She has no decreased breath sounds. She has no wheezes. She has no rhonchi.  Lymphadenopathy:    She has no cervical adenopathy.  Skin: Skin is warm. She is not diaphoretic. No pallor.  Psychiatric: She has a normal mood and affect. Her behavior is normal. Her mood appears not anxious. She does not exhibit a depressed mood.   Results for orders placed or performed in visit on 07/07/16  POCT Influenza A/B  Result Value Ref Range   Influenza A, POC Negative Negative   Influenza B, POC Positive (A)  Negative      Assessment & Plan:   Problem List Items Addressed This Visit    None    Visit Diagnoses    Influenza B    -  Primary   explained dx, risks of pneumonia, dehydration, reasons to go to ER; start Tamiflu; rest, hydration; etc   Relevant Medications   oseltamivir (TAMIFLU) 75 MG capsule   Fever and chills       testing for influenza positive for B   Relevant Orders   POCT Influenza A/B (Completed)       Follow up plan: Return if symptoms worsen or fail to improve.  An after-visit summary was printed and given to the patient at [place].  Please see the patient instructions which may contain other information and recommendations beyond what is mentioned above in the assessment and plan.  Meds ordered this encounter  Medications  . DISCONTD: vitamin B-12 (CYANOCOBALAMIN) 500 MCG tablet    Sig: Take by mouth.  . oseltamivir  (TAMIFLU) 75 MG capsule    Sig: Take 1 capsule (75 mg total) by mouth 2 (two) times daily.    Dispense:  10 capsule    Refill:  0    Orders Placed This Encounter  Procedures  . POCT Influenza A/B

## 2016-07-07 NOTE — Progress Notes (Signed)
Patient called; vomited after taking pill; fever 101; okay to take fever reducer; all she has is aleve; I explained tylenol might be easier on her stomach; Rx for anti-emetic; call me back if needed; she agrees

## 2016-07-09 ENCOUNTER — Telehealth: Payer: Self-pay | Admitting: Family Medicine

## 2016-07-09 MED ORDER — LEVOFLOXACIN 500 MG PO TABS: 500.0000 mg | ORAL_TABLET | Freq: Every day | ORAL | 0 refills | Status: DC

## 2016-07-09 MED ORDER — BENZONATATE 100 MG PO CAPS: 100.0000 mg | ORAL_CAPSULE | Freq: Three times a day (TID) | ORAL | 0 refills | Status: AC | PRN

## 2016-07-09 NOTE — Telephone Encounter (Signed)
She is keeping a low-grade fever The pills gave her insomnia Yesterday, she was coughing so hard she was gagging and tight in her chest; can't stand to talk long Congestion; no trouble breathing, getting enough air, does not think she needs to go to the hospital; would like to try antibiotic if that might help No hx of pneumonia Rx for levaquin, tessalon perles Rest and vitamin C

## 2016-07-09 NOTE — Telephone Encounter (Signed)
Was seen about 2 days ago and was dx with the flu. Not feeling any better. Still have low grade fever, no taste, chest tight, no voice, congestion, head hurt. Was given tamiflu but it made her sick. She did try taking it again but she has insomnia with it and states that is a side effect from it. Please advise

## 2016-07-10 ENCOUNTER — Telehealth: Payer: Self-pay

## 2016-07-10 MED ORDER — AMOXICILLIN 875 MG PO TABS: 875.0000 mg | ORAL_TABLET | Freq: Two times a day (BID) | ORAL | 0 refills | Status: DC

## 2016-07-10 NOTE — Telephone Encounter (Signed)
States is not allergic to amoxil

## 2016-07-10 NOTE — Telephone Encounter (Signed)
rx sent

## 2016-07-10 NOTE — Telephone Encounter (Signed)
Pt.notified

## 2016-07-10 NOTE — Telephone Encounter (Signed)
Patient Called states is scared to take the Levaquin because it is a strong antibiotic.  Wants to see if you can send in Amoxil instead?

## 2016-07-10 NOTE — Telephone Encounter (Signed)
We have that's allergic to cephalexin Can she take amoxicillin?

## 2016-07-13 ENCOUNTER — Ambulatory Visit
Admission: RE | Admit: 2016-07-13 | Discharge: 2016-07-13 | Disposition: A | Discharge status: Home / Self Care | Payer: BLUE CROSS/BLUE SHIELD | Source: Ambulatory Visit | Attending: Family Medicine | Admitting: Family Medicine

## 2016-07-13 ENCOUNTER — Encounter: Payer: Self-pay | Admitting: Family Medicine

## 2016-07-13 ENCOUNTER — Ambulatory Visit (INDEPENDENT_AMBULATORY_CARE_PROVIDER_SITE_OTHER): Payer: BLUE CROSS/BLUE SHIELD | Admitting: Family Medicine

## 2016-07-13 VITALS — BP 118/70 | HR 88 | Temp 99.0°F | Resp 16 | Wt 149.0 lb

## 2016-07-13 DIAGNOSIS — R05 Cough: Secondary | ICD-10-CM

## 2016-07-13 DIAGNOSIS — J101 Influenza due to other identified influenza virus with other respiratory manifestations: Secondary | ICD-10-CM

## 2016-07-13 DIAGNOSIS — I700 Atherosclerosis of aorta: Secondary | ICD-10-CM | POA: Insufficient documentation

## 2016-07-13 DIAGNOSIS — R059 Cough, unspecified: Secondary | ICD-10-CM

## 2016-07-13 IMAGING — CR DG CHEST 2V
1 series · 2 of 2 positions shown · non-contrast
Comparison: None.

CLINICAL DATA: 55-year-old diagnosed with influenza the 6 days ago,
presenting now with acute cough, low-grade fever and mid to
right-sided chest pain.

EXAM:
CHEST  2 VIEW

[Series 1: dg chest 2 view · 0.14mm/px · 2 of 2 slices shown]
[im 1/2]
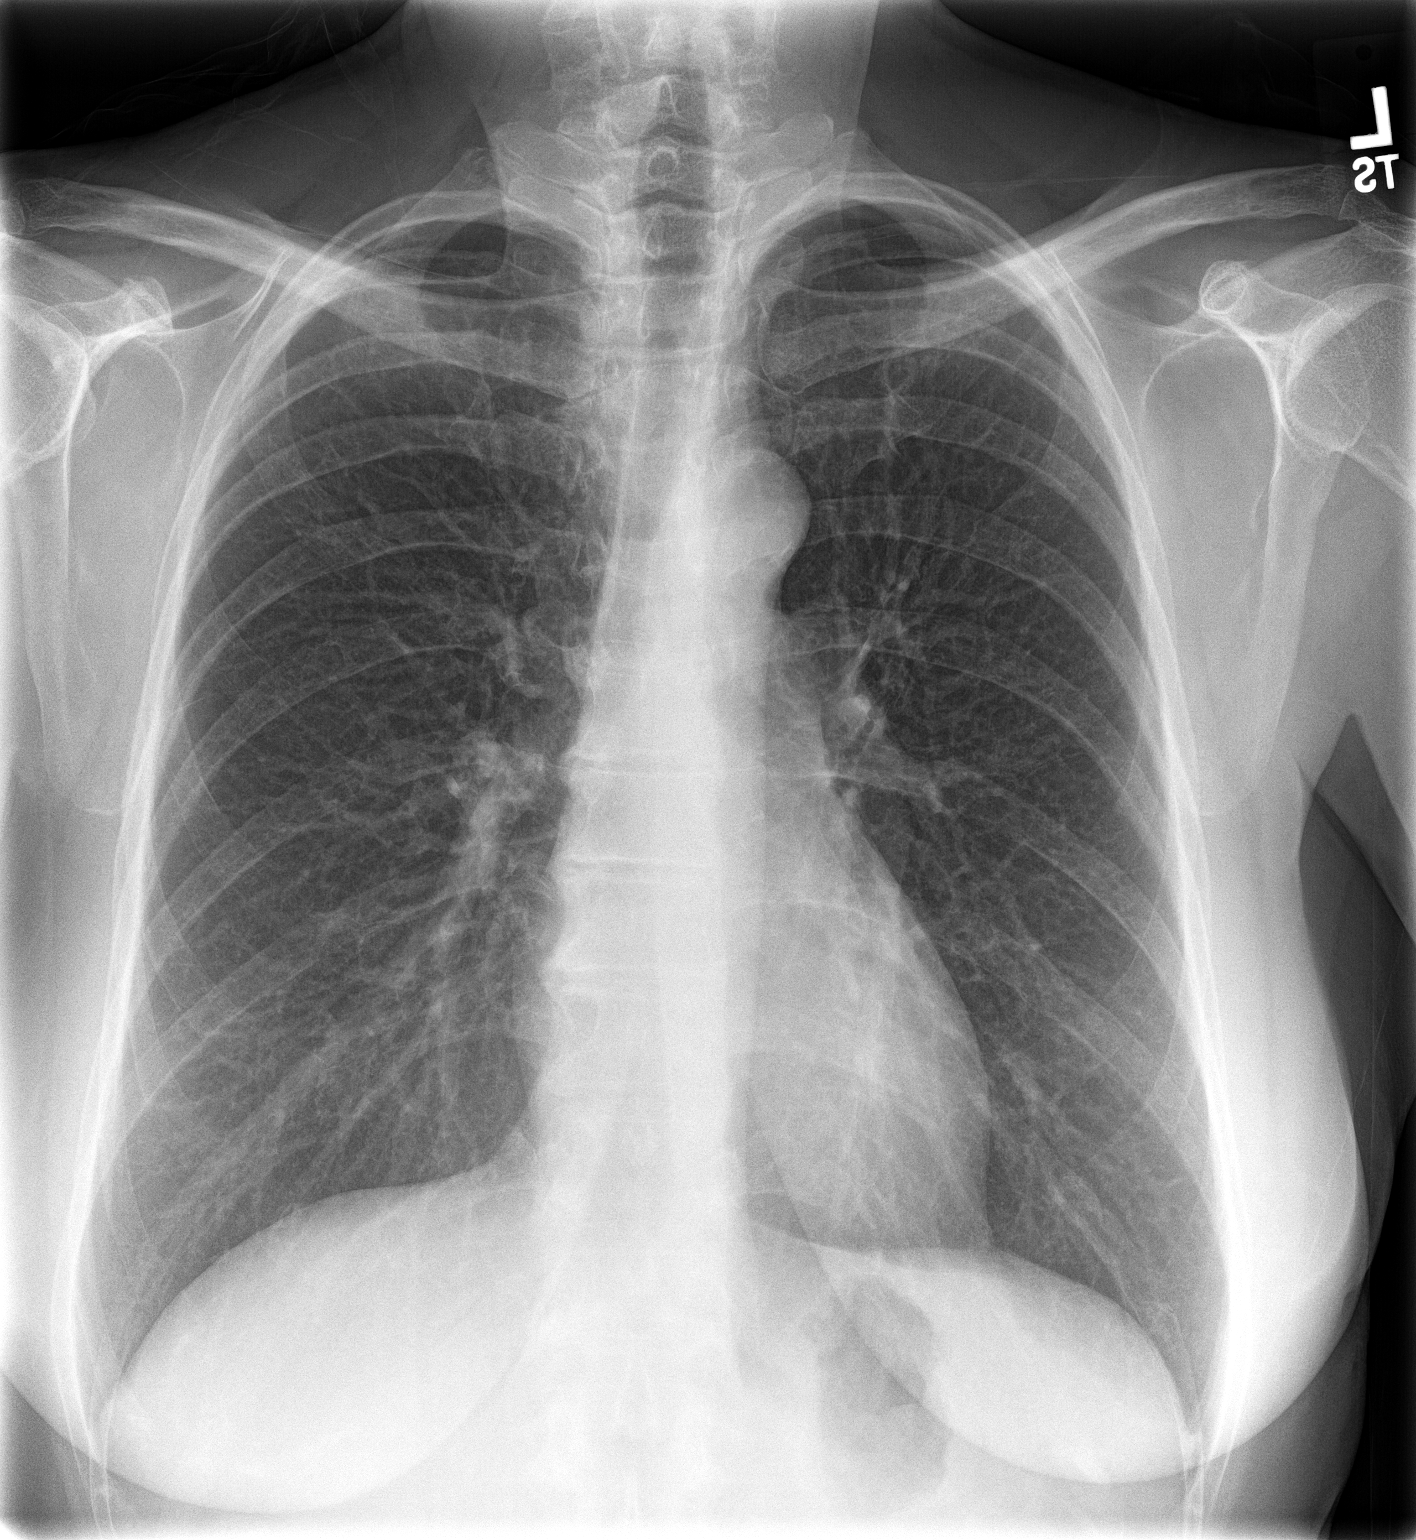
[im 2/2]
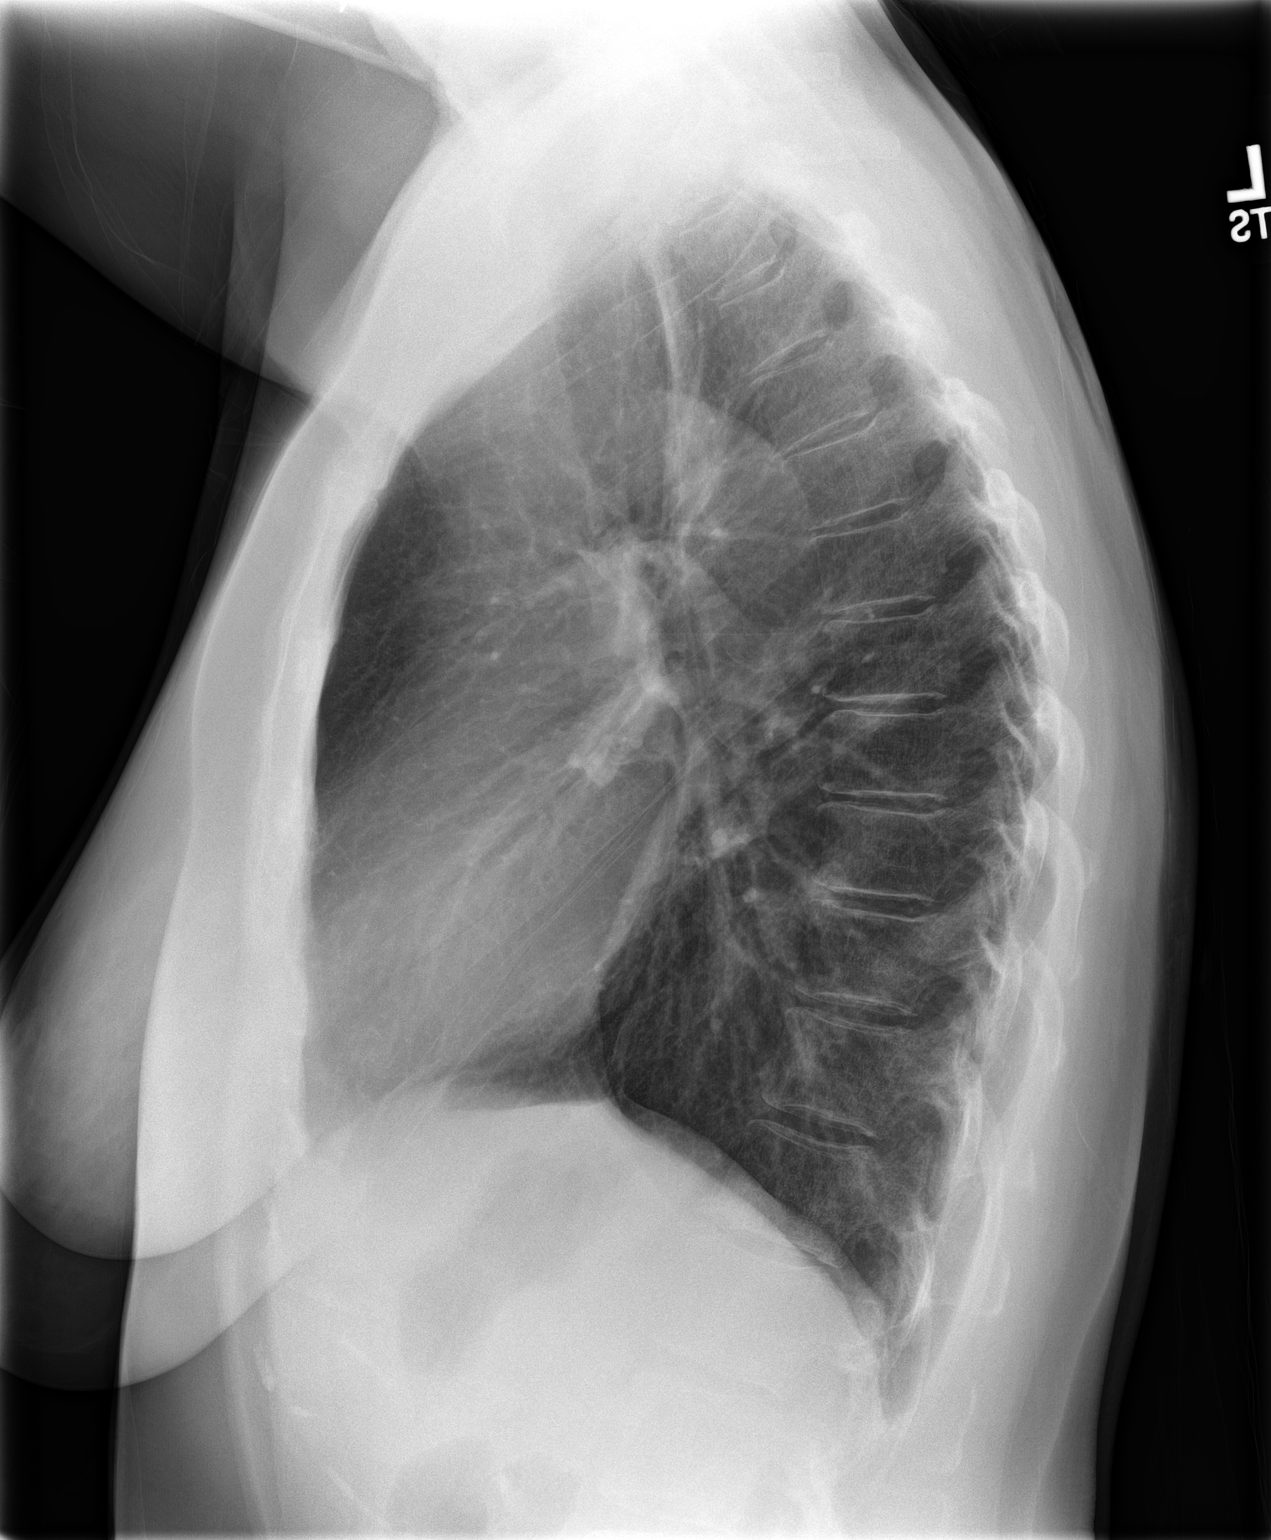

[2 of 2 positions shown; findings below may reference images not displayed]

FINDINGS: Cardiac silhouette normal in size. Thoracic aorta minimally
atherosclerotic. Hilar and mediastinal contours otherwise
unremarkable. Lungs clear. Bronchovascular markings normal.
Pulmonary vascularity normal. No visible pleural effusions. No
pneumothorax. Mild degenerative changes involving the thoracic
spine.
IMPRESSION: 1.  No acute cardiopulmonary disease.
2. Minimal thoracic aortic atherosclerosis.

## 2016-07-13 NOTE — Patient Instructions (Signed)
Rest and hydration Dry plain mucinex to help loosen phlegm Get the chest xray across the street

## 2016-07-13 NOTE — Progress Notes (Signed)
BP 118/70   Pulse 88   Temp 99 F (37.2 C) (Oral)   Resp 16   Wt 149 lb (67.6 kg)   SpO2 97%   BMI 24.05 kg/m    Subjective:    Patient ID: Vickie Drake, female    DOB: [birth year], 56 y.o.   MRN: [id number]  HPI: Vickie Drake is a 56 y.o. female  Chief Complaint  Patient presents with  . Follow-up    Chest congestion, chest tightness, fatigue. Pt has not been out hher bed since the last time she was here.    Patient is here for an acute visit Diagnosed with flu B a week ago tomorrow; started Sunday a week ago Just so tired Chest congestion Cough is kind of down deep; trouble getting stuff up Using tessalon perles Drinking fluids She was given Rx for Levaquin and read the side effects and was scared to take it; then I called in amoxicillin, and this is her sixth pill today She only got two doses of tamiflu down, but vomited and did not want to take any more  Depression screen [facility] 2/9 07/13/2016 07/07/2016 01/28/2016  Decreased Interest 0 0 0  Down, Depressed, Hopeless 0 0 0  PHQ - 2 Score 0 0 0   Relevant past medical, surgical, family and social history reviewed Past Medical History:  Diagnosis Date  . Fall from horse 11/2012   left leg injury  . Fatigue   . Gross hematuria   . History of panic attacks   . Hypothyroidism   . Vitamin B12 deficiency    Family History  Problem Relation Age of Onset  . Diabetes Mother   . Cancer Mother     breast  . Heart disease Father   . Heart attack Father   . Hypertension Father   . Diabetes Sister   . Diabetes Brother    Social History  Substance Use Topics  . Smoking status: Former Research scientist (life sciences)  . Smokeless tobacco: Never Used  . Alcohol use 8.4 oz/week    14 Glasses of wine per week     Comment: wine only   Interim medical history since last visit reviewed. Allergies and medications reviewed  Review of Systems Per HPI unless specifically indicated above     Objective:    BP 118/70   Pulse 88   Temp 99  F (37.2 C) (Oral)   Resp 16   Wt 149 lb (67.6 kg)   SpO2 97%   BMI 24.05 kg/m   Wt Readings from Last 3 Encounters:  07/13/16 149 lb (67.6 kg)  07/07/16 152 lb 4 oz (69.1 kg)  01/28/16 151 lb (68.5 kg)    Physical Exam  Constitutional: She appears well-developed and well-nourished. No distress (appears to not feel well but nontoxic).  Middle-aged female; does not appear toxic; wearing mask; speaking comfortable, no diaphoresis  HENT:  Right Ear: External ear normal.  Left Ear: External ear normal.  Nose: Nose normal.  Mouth/Throat: Oropharynx is clear and moist and mucous membranes are normal. No oropharyngeal exudate.  Eyes: EOM are normal. Right eye exhibits no discharge. Left eye exhibits no discharge. No scleral icterus.  Cardiovascular: Normal rate and regular rhythm.   Pulmonary/Chest: Effort normal and breath sounds normal. No accessory muscle usage. No respiratory distress. She has no decreased breath sounds. She has no wheezes. She has no rhonchi.  Abdominal: Normal appearance.  Lymphadenopathy:    She has no cervical adenopathy.  Neurological: She  is alert. She displays no tremor. Gait normal.  Skin: Skin is warm. No rash noted. She is not diaphoretic. No pallor.  Psychiatric: She has a normal mood and affect. Her behavior is normal. Her mood appears not anxious. She does not exhibit a depressed mood.  Pleasant and cooperative   Results for orders placed or performed in visit on 07/07/16  POCT Influenza A/B  Result Value Ref Range   Influenza A, POC Negative Negative   Influenza B, POC Positive (A) Negative      Assessment & Plan:   Problem List Items Addressed This Visit    None    Visit Diagnoses    Influenza B    -  Primary   rest, hydration; b/c of complications of pneumonia and the severity of this year's flu outbreak, will get CXR; supportive care, hydration encouraged   Relevant Orders   DG Chest 2 View   Cough       will get chest xray b/c of her  complaints, but I clinically don't hear any pneumonia; sats are adequate; rest, hydration, add mucinex (plain)       Follow up plan: No Follow-up on file.  An after-visit summary was printed and given to the patient at [place].  Please see the patient instructions which may contain other information and recommendations beyond what is mentioned above in the assessment and plan.  Meds ordered this encounter  Medications  . DISCONTD: levofloxacin (LEVAQUIN) 500 MG tablet    Sig: Take 500 mg by mouth daily.  (somehow this drug was added back to the medicine list, even though she is not taking it; it was on her AVS, but I caught it and removed it after she left, Dr. Sanda Klein)  Orders Placed This Encounter  Procedures  . DG Chest 2 View

## 2016-07-29 ENCOUNTER — Ambulatory Visit (INDEPENDENT_AMBULATORY_CARE_PROVIDER_SITE_OTHER): Payer: BLUE CROSS/BLUE SHIELD | Admitting: Family Medicine

## 2016-07-29 ENCOUNTER — Encounter: Payer: Self-pay | Admitting: Family Medicine

## 2016-07-29 VITALS — BP 112/68 | HR 87 | Temp 98.0°F | Resp 14 | Wt 147.8 lb

## 2016-07-29 DIAGNOSIS — H5712 Ocular pain, left eye: Secondary | ICD-10-CM

## 2016-07-29 DIAGNOSIS — E039 Hypothyroidism, unspecified: Secondary | ICD-10-CM | POA: Diagnosis not present

## 2016-07-29 DIAGNOSIS — R3129 Other microscopic hematuria: Secondary | ICD-10-CM

## 2016-07-29 DIAGNOSIS — R739 Hyperglycemia, unspecified: Secondary | ICD-10-CM | POA: Diagnosis not present

## 2016-07-29 DIAGNOSIS — R7989 Other specified abnormal findings of blood chemistry: Secondary | ICD-10-CM

## 2016-07-29 DIAGNOSIS — R002 Palpitations: Secondary | ICD-10-CM

## 2016-07-29 LAB — COMPLETE METABOLIC PANEL WITH GFR
ALBUMIN: 4.5 g/dL (ref 3.6–5.1)
ALK PHOS: 42 U/L (ref 33–130)
ALT: 11 U/L (ref 6–29)
AST: 16 U/L (ref 10–35)
BILIRUBIN TOTAL: 0.9 mg/dL (ref 0.2–1.2)
BUN: 16 mg/dL (ref 7–25)
CALCIUM: 9.5 mg/dL (ref 8.6–10.4)
CO2: 27 mmol/L (ref 20–31)
Chloride: 105 mmol/L (ref 98–110)
Creat: 0.6 mg/dL (ref 0.50–1.05)
Glucose, Bld: 95 mg/dL (ref 65–99)
POTASSIUM: 4.3 mmol/L (ref 3.5–5.3)
Sodium: 141 mmol/L (ref 135–146)
TOTAL PROTEIN: 7.2 g/dL (ref 6.1–8.1)

## 2016-07-29 LAB — MAGNESIUM: MAGNESIUM: 1.9 mg/dL (ref 1.5–2.5)

## 2016-07-29 LAB — HEMOGLOBIN A1C
Hgb A1c MFr Bld: 5.2 % (ref <5.7)
Mean Plasma Glucose: 103 mg/dL

## 2016-07-29 LAB — FERRITIN: FERRITIN: 260 ng/mL — AB (ref 10–232)

## 2016-07-29 LAB — TSH: TSH: 3.6 m[IU]/L

## 2016-07-29 NOTE — Assessment & Plan Note (Addendum)
Recheck today; patient had seen hematologist before; will see if still an issue and direct back if elevated

## 2016-07-29 NOTE — Progress Notes (Signed)
BP 112/68   Pulse 87   Temp 98 F (36.7 C) (Oral)   Resp 14   Wt 147 lb 12.8 oz (67 kg)   SpO2 97%   BMI 23.86 kg/m    Subjective:    Patient ID: Vickie Drake, female    DOB: Aug 02, 1960, 56 y.o.   MRN: [id number]  HPI: Vickie Drake is a 56 y.o. female  Chief Complaint  Patient presents with  . Follow-up    6 months  . Eye Pain   She had the flu and was sick for a few weeks; feeling better now  She has been having pain over the left eye for months; not every time; just sometimes; over the eyebrow; hurts t press on it; just when really pressed, or moving eyebrow a certain way; no skin chanrges; she did have a red eyelid one day and her eye doctor gave her drops and it cleared up; 3 months; pain happened after that; no blurred vision except for early morning; no sinus drainage  Hx of elevated ferritin level; 218 in January 2017; normal B12 and vit D  Fluttering of her heart; drinking just half caf, and has seen her heart doctor, Dr. Saralyn Pilar; just conscious of it; skipped beat; saw cardiologist; no holter monitor; he did an ultrasound 2 years ago and it was okay she says; not much chocolate; no tea  Hyperglycemia; last A1c normal, but glucose elevated  Total chol is high because she has so much HDL (last level 91); mother is 45  Due for recheck thyroid; bowels moving okay; no hair loss; hair is just thin  Due for mammogram; ordered by GYN  Depression screen [facility] 2/9 07/29/2016 07/13/2016 07/07/2016 01/28/2016  Decreased Interest 0 0 0 0  Down, Depressed, Hopeless 0 0 0 0  PHQ - 2 Score 0 0 0 0   Relevant past medical, surgical, family and social history reviewed Past Medical History:  Diagnosis Date  . Fall from horse 11/2012   left leg injury  . Fatigue   . Gross hematuria   . History of panic attacks   . Hypothyroidism   . Vitamin B12 deficiency    Past Surgical History:  Procedure Laterality Date  . ABDOMINAL HYSTERECTOMY    . APPENDECTOMY    . BREAST  BIOPSY    . BREAST LUMPECTOMY     Family History  Problem Relation Age of Onset  . Diabetes Mother   . Cancer Mother     breast  . Heart disease Father   . Heart attack Father   . Hypertension Father   . Diabetes Sister   . Diabetes Brother    Social History  Substance Use Topics  . Smoking status: Former Research scientist (life sciences)  . Smokeless tobacco: Never Used  . Alcohol use 8.4 oz/week    14 Glasses of wine per week     Comment: wine only  MD note: we discussed alcohol intake; urged no more than 7 drinks per week; call me if I can help  Interim medical history since last visit reviewed. Allergies and medications reviewed  Review of Systems Per HPI unless specifically indicated above     Objective:    BP 112/68   Pulse 87   Temp 98 F (36.7 C) (Oral)   Resp 14   Wt 147 lb 12.8 oz (67 kg)   SpO2 97%   BMI 23.86 kg/m   Wt Readings from Last 3 Encounters:  08/07/16 156 lb 8.4  oz (71 kg)  07/29/16 147 lb 12.8 oz (67 kg)  07/13/16 149 lb (67.6 kg)    Physical Exam  Constitutional: She appears well-developed and well-nourished. No distress.  HENT:  Head: Normocephalic and atraumatic.  Right Ear: Tympanic membrane and ear canal normal.  Left Ear: Tympanic membrane and ear canal normal.  Nose: No mucosal edema or rhinorrhea.  Mouth/Throat: Oropharynx is clear and moist and mucous membranes are normal.  Eyes: EOM and lids are normal. Pupils are equal, round, and reactive to light. Right eye exhibits no discharge and no hordeolum. Left eye exhibits no discharge and no hordeolum. Right conjunctiva is not injected. Left conjunctiva is not injected. No scleral icterus. Right eye exhibits no nystagmus. Left eye exhibits no nystagmus.  Neck: No thyromegaly present.  Cardiovascular: Normal rate, regular rhythm and normal heart sounds.   No murmur heard. Pulmonary/Chest: Effort normal and breath sounds normal. No respiratory distress. She has no wheezes.  Abdominal: Soft. Bowel sounds are  normal. She exhibits no distension.  Musculoskeletal: Normal range of motion. She exhibits no edema.  Neurological: She is alert. She exhibits normal muscle tone.  Skin: Skin is warm and dry. She is not diaphoretic. No pallor.  Psychiatric: She has a normal mood and affect. Her behavior is normal. Judgment and thought content normal.      Assessment & Plan:   Problem List Items Addressed This Visit      Endocrine   Adult hypothyroidism    Check TSH      Relevant Orders   TSH (Completed)     Genitourinary   Hematuria, microscopic    Saw urologist        Other   Elevated ferritin level    Recheck today; patient had seen hematologist before; will see if still an issue and direct back if elevated      Relevant Orders   Ferritin (Completed)   Blood glucose elevated    Check fasting glucose and a1c      Relevant Orders   Hemoglobin A1c (Completed)    Other Visit Diagnoses    Palpitation    -  Primary   worked up by cardiologist; patient reports no holter monitor, though, so we'll order that; check K+, Mg2+   Relevant Orders   Magnesium (Completed)   COMPLETE METABOLIC PANEL WITH GFR (Completed)   Holter monitor - 48 hour   Eye pain, left       I see nothing on exam; encouraged her to see her eye doctor      Follow up plan: Return in about 6 months (around 01/26/2017) for fasting labs and visit.  An after-visit summary was printed and given to the patient at [place].  Please see the patient instructions which may contain other information and recommendations beyond what is mentioned above in the assessment and plan.  No orders of the defined types were placed in this encounter.   Orders Placed This Encounter  Procedures  . Magnesium  . Ferritin  . Hemoglobin A1c  . COMPLETE METABOLIC PANEL WITH GFR  . TSH  . Holter monitor - 48 hour

## 2016-07-29 NOTE — Assessment & Plan Note (Signed)
Check fasting glucose and a1c

## 2016-07-29 NOTE — Assessment & Plan Note (Signed)
Saw urologist 

## 2016-07-29 NOTE — Assessment & Plan Note (Signed)
Check TSH 

## 2016-07-29 NOTE — Patient Instructions (Signed)
Let's check labs Please do schedule your mammogram Contact your eye doctor about your symptoms Try to limit saturated fats in your diet (bologna, hot dogs, barbeque, cheeseburgers, hamburgers, steak, bacon, sausage, cheese, etc.) and get more fresh fruits, vegetables, and whole grains

## 2016-07-30 ENCOUNTER — Other Ambulatory Visit: Payer: Self-pay

## 2016-07-30 DIAGNOSIS — R7989 Other specified abnormal findings of blood chemistry: Secondary | ICD-10-CM

## 2016-08-06 LAB — HM PAP SMEAR: HM PAP: NORMAL

## 2016-08-07 ENCOUNTER — Inpatient Hospital Stay: Payer: BLUE CROSS/BLUE SHIELD | Attending: Oncology | Admitting: Oncology

## 2016-08-07 ENCOUNTER — Encounter: Payer: Self-pay | Admitting: Oncology

## 2016-08-07 VITALS — BP 126/78 | HR 80 | Temp 96.4°F | Resp 18 | Ht 66.0 in | Wt 156.5 lb

## 2016-08-07 DIAGNOSIS — E039 Hypothyroidism, unspecified: Secondary | ICD-10-CM

## 2016-08-07 DIAGNOSIS — Z9181 History of falling: Secondary | ICD-10-CM

## 2016-08-07 DIAGNOSIS — R05 Cough: Secondary | ICD-10-CM | POA: Diagnosis not present

## 2016-08-07 DIAGNOSIS — Z87891 Personal history of nicotine dependence: Secondary | ICD-10-CM | POA: Diagnosis not present

## 2016-08-07 DIAGNOSIS — Z803 Family history of malignant neoplasm of breast: Secondary | ICD-10-CM

## 2016-08-07 DIAGNOSIS — R7989 Other specified abnormal findings of blood chemistry: Secondary | ICD-10-CM | POA: Diagnosis not present

## 2016-08-07 DIAGNOSIS — Z79899 Other long term (current) drug therapy: Secondary | ICD-10-CM | POA: Diagnosis not present

## 2016-08-07 DIAGNOSIS — Z87828 Personal history of other (healed) physical injury and trauma: Secondary | ICD-10-CM | POA: Insufficient documentation

## 2016-08-07 DIAGNOSIS — R509 Fever, unspecified: Secondary | ICD-10-CM | POA: Diagnosis not present

## 2016-08-07 DIAGNOSIS — I700 Atherosclerosis of aorta: Secondary | ICD-10-CM

## 2016-08-07 NOTE — Progress Notes (Signed)
Hematology/Oncology Consult note [facility]  [phone] [phone]  Patient Care Team: Arnetha Courser, MD as PCP - General (Family Medicine) Nickie Retort, MD as Consulting Physician (Urology)   Name of the patient: Vickie Drake  [id number]  11-08-1960   Date of visit: 08/07/16  Diagnosis- elevated ferritin  Chief complaint/ Reason for visit- routine f/u   Heme/Onc history: Patient is a 56 year old female who was last seen by Dr. Rudean Hitt in February 2017 for elevated ferritin. At that time she has had a recent fall and injured her left side of pelvis. Her ferritin has been ranging anywhere from 147-218. She also had hereditary hemochromatosis testing which was negative. ESR and CRP that was checked previously was normal. On 07/29/2016 her ferritin was 260. Recent CMP from 07/29/2016 was also within normal limits.  Interval history- she is doing well and denies any complaints today. Her appetite is good and there is no unintentional weight loss. She underwent mammogram yesterday which was within normal limits. She had a colonoscopy about 5 years ago and was asked to get a repeat colonoscopy in 3 years time and she has not had that yet  ECOG PS- 0   Review of systems- Review of Systems  Constitutional: Negative for chills, fever, malaise/fatigue and weight loss.  HENT: Negative for congestion, ear discharge and nosebleeds.   Eyes: Negative for blurred vision.  Respiratory: Negative for cough, hemoptysis, sputum production, shortness of breath and wheezing.   Cardiovascular: Negative for chest pain, palpitations, orthopnea and claudication.  Gastrointestinal: Negative for abdominal pain, blood in stool, constipation, diarrhea, heartburn, melena, nausea and vomiting.  Genitourinary: Negative for dysuria, flank pain, frequency, hematuria and urgency.  Musculoskeletal: Negative for back pain, joint pain and myalgias.  Skin: Negative  for rash.  Neurological: Negative for dizziness, tingling, focal weakness, seizures, weakness and headaches.  Endo/Heme/Allergies: Does not bruise/bleed easily.  Psychiatric/Behavioral: Negative for depression and suicidal ideas. The patient does not have insomnia.      Current treatment- observation  Allergies  Allergen Reactions  . Cephalexin Other (See Comments)  . Tetracycline Other (See Comments)     Past Medical History:  Diagnosis Date  . Fall from horse 11/2012   left leg injury  . Fatigue   . Gross hematuria   . History of panic attacks   . Hypothyroidism   . Vitamin B12 deficiency      Past Surgical History:  Procedure Laterality Date  . ABDOMINAL HYSTERECTOMY    . APPENDECTOMY    . BREAST BIOPSY    . BREAST LUMPECTOMY      Social History   Social History  . Marital status: Married    Spouse name: N/A  . Number of children: N/A  . Years of education: N/A   Occupational History  . Not on file.   Social History Main Topics  . Smoking status: Former Research scientist (life sciences)  . Smokeless tobacco: Never Used  . Alcohol use 8.4 oz/week    14 Glasses of wine per week     Comment: wine only  . Drug use: No  . Sexual activity: Not on file     Comment: 5 years of smoking at age of 24   Other Topics Concern  . Not on file   Social History Narrative  . No narrative on file    Family History  Problem Relation Age of Onset  . Diabetes Mother   . Cancer Mother     breast  .  Heart disease Father   . Heart attack Father   . Hypertension Father   . Diabetes Sister   . Diabetes Brother      Current Outpatient Prescriptions:  .  BIOTIN PO, Take by mouth daily. Reported on 07/29/2015, Disp: , Rfl:  .  Cholecalciferol (VITAMIN D PO), Take by mouth daily., Disp: , Rfl:  .  Multiple Vitamin (MULTIVITAMIN) capsule, Take by mouth daily., Disp: , Rfl:  .  vitamin B-12 (CYANOCOBALAMIN) 500 MCG tablet, Take 500 mcg by mouth daily., Disp: , Rfl:  .  vitamin E 1000 UNIT  capsule, Take 1,000 Units by mouth daily., Disp: , Rfl:   Physical exam:  Vitals:   08/07/16 0850  BP: 126/78  Pulse: 80  Resp: 18  Temp: (!) 96.4 F (35.8 C)  TempSrc: Tympanic  Weight: 156 lb 8.4 oz (71 kg)  Height: '5\' 6"'  (1.676 m)   Physical Exam  Constitutional: She is oriented to person, place, and time and well-developed, well-nourished, and in no distress.  HENT:  Head: Normocephalic and atraumatic.  Eyes: EOM are normal. Pupils are equal, round, and reactive to light.  Neck: Normal range of motion.  Cardiovascular: Normal rate, regular rhythm and normal heart sounds.   Pulmonary/Chest: Effort normal and breath sounds normal.  Abdominal: Soft. Bowel sounds are normal.  Neurological: She is alert and oriented to person, place, and time.  Skin: Skin is warm and dry.     CMP Latest Ref Rng & Units 07/29/2016  Glucose 65 - 99 mg/dL 95  BUN 7 - 25 mg/dL 16  Creatinine 0.50 - 1.05 mg/dL 0.60  Sodium 135 - 146 mmol/L 141  Potassium 3.5 - 5.3 mmol/L 4.3  Chloride 98 - 110 mmol/L 105  CO2 20 - 31 mmol/L 27  Calcium 8.6 - 10.4 mg/dL 9.5  Total Protein 6.1 - 8.1 g/dL 7.2  Total Bilirubin 0.2 - 1.2 mg/dL 0.9  Alkaline Phos 33 - 130 U/L 42  AST 10 - 35 U/L 16  ALT 6 - 29 U/L 11   CBC Latest Ref Rng & Units 01/28/2016  WBC 3.8 - 10.8 K/uL 5.2  Hemoglobin 11.7 - 15.5 g/dL 14.0  Hematocrit 35.0 - 45.0 % 40.9  Platelets 140 - 400 K/uL 197    No images are attached to the encounter.  Dg Chest 2 View  Result Date: 07/13/2016 CLINICAL DATA:  56 year old diagnosed with influenza the 6 days ago, presenting now with acute cough, low-grade fever and mid to right-sided chest pain. EXAM: CHEST  2 VIEW COMPARISON:  None. FINDINGS: Cardiac silhouette normal in size. Thoracic aorta minimally atherosclerotic. Hilar and mediastinal contours otherwise unremarkable. Lungs clear. Bronchovascular markings normal. Pulmonary vascularity normal. No visible pleural effusions. No pneumothorax. Mild  degenerative changes involving the thoracic spine. IMPRESSION: 1.  No acute cardiopulmonary disease. 2. Minimal thoracic aortic atherosclerosis. Electronically Signed   By: Evangeline Dakin M.D.   On: 07/13/2016 14:22     Assessment and plan- Patient is a 56 y.o. female follows up with [place] for elevated ferritin  I explained to the patient that looking at her prior ferritin she has a very mild elevation of her ferritin which is nonspecific. She has already undergone hereditary hemochromatosis testing which was negative. Her liver function tests are within normal limits and there is clinically no evidence of iron overload. Patient does not require any phlebotomy at this time. Clinically there are no signs and symptoms concerning for malignancy. I will refer her to GI as  she was supposed to get a colonoscopy 2 years ago. Patient's son was noted to have elevated ferritin of Clozaril 1000 and is getting phlebotomy with a routine basis. I explained to the patient that when the ferritin is close to [phone], that is something which needs further work up including looking for malignancy. The patient does not have any evidence of diabetes or metabolic syndrome and her ferritin is only mildly elevated which is often nonspecific. We will check a repeat ferritin as well as CBC and CMP in 6 months time given that patient is anxious. I'll see her back in one year's time with same labs   Visit Diagnosis 1. Elevated ferritin level      Dr. Randa Evens, MD, MPH [facility] Pager- [phone] 08/07/2016  9:22 AM

## 2016-08-07 NOTE — Progress Notes (Signed)
Pt does not sleep well-wakes up around 2-3 am and stays up to 5 am each night.  She also states that her son was recently seen for high ferritin and he is on medication and getting phlebotomies weekly. She had the flu about 1 month ago.

## 2016-08-07 NOTE — Addendum Note (Signed)
Addended by: Luella Cook on: 08/07/2016 05:56 PM   Modules accepted: Orders

## 2016-08-11 ENCOUNTER — Ambulatory Visit: Payer: BLUE CROSS/BLUE SHIELD | Admitting: Oncology

## 2017-02-09 ENCOUNTER — Inpatient Hospital Stay: Payer: BLUE CROSS/BLUE SHIELD | Attending: Oncology

## 2017-08-02 NOTE — Progress Notes (Deleted)
Hematology/Oncology Consult note [facility]  [phone] [phone]  Patient Care Team: Arnetha Courser, MD as PCP - General (Family Medicine) Nickie Retort, MD as Consulting Physician (Urology)   Name of the patient: Vickie Drake  [id number]  28-Jun-1960   Date of visit: 08/02/17  Diagnosis- elevated ferritin  Chief complaint/ Reason for visit- routine f/u of elevated ferritiin  Heme/Onc history: Patient is a 57 year old female who was last seen by Dr. Rudean Hitt in February 2017 for elevated ferritin. At that time she has had a recent fall and injured her left side of pelvis. Her ferritin has been ranging anywhere from 147-218. She also had hereditary hemochromatosis testing which was negative. ESR and CRP that was checked previously was normal. On 07/29/2016 her ferritin was 260. Recent CMP from 07/29/2016 was also within normal limits.    Interval history- ***  ECOG PS- *** Pain scale- *** Opioid associated constipation- ***  Review of systems- ROS   Current treatment- ***  Allergies  Allergen Reactions  . Cephalexin Other (See Comments)  . Tetracycline Other (See Comments)     Past Medical History:  Diagnosis Date  . Fall from horse 11/2012   left leg injury  . Fatigue   . Gross hematuria   . History of panic attacks   . Hypothyroidism   . Vitamin B12 deficiency      Past Surgical History:  Procedure Laterality Date  . ABDOMINAL HYSTERECTOMY    . APPENDECTOMY    . BREAST BIOPSY    . BREAST LUMPECTOMY      Social History   Socioeconomic History  . Marital status: Married    Spouse name: Not on file  . Number of children: Not on file  . Years of education: Not on file  . Highest education level: Not on file  Social Needs  . Financial resource strain: Not on file  . Food insecurity - worry: Not on file  . Food insecurity - inability: Not on file  . Transportation needs - medical: Not on file    . Transportation needs - non-medical: Not on file  Occupational History  . Not on file  Tobacco Use  . Smoking status: Former Research scientist (life sciences)  . Smokeless tobacco: Never Used  Substance and Sexual Activity  . Alcohol use: Yes    Alcohol/week: 8.4 oz    Types: 14 Glasses of wine per week    Comment: wine only  . Drug use: No  . Sexual activity: Not on file    Comment: 5 years of smoking at age of 57  Other Topics Concern  . Not on file  Social History Narrative  . Not on file    Family History  Problem Relation Age of Onset  . Diabetes Mother   . Cancer Mother        breast  . Heart disease Father   . Heart attack Father   . Hypertension Father   . Diabetes Sister   . Diabetes Brother      Current Outpatient Medications:  .  BIOTIN PO, Take by mouth daily. Reported on 07/29/2015, Disp: , Rfl:  .  Cholecalciferol (VITAMIN D PO), Take by mouth daily., Disp: , Rfl:  .  Multiple Vitamin (MULTIVITAMIN) capsule, Take by mouth daily., Disp: , Rfl:  .  vitamin B-12 (CYANOCOBALAMIN) 500 MCG tablet, Take 500 mcg by mouth daily., Disp: , Rfl:  .  vitamin E 1000 UNIT capsule, Take 1,000 Units by  mouth daily., Disp: , Rfl:   Physical exam: There were no vitals filed for this visit. Physical Exam   CMP Latest Ref Rng & Units 07/29/2016  Glucose 65 - 99 mg/dL 95  BUN 7 - 25 mg/dL 16  Creatinine 0.50 - 1.05 mg/dL 0.60  Sodium 135 - 146 mmol/L 141  Potassium 3.5 - 5.3 mmol/L 4.3  Chloride 98 - 110 mmol/L 105  CO2 20 - 31 mmol/L 27  Calcium 8.6 - 10.4 mg/dL 9.5  Total Protein 6.1 - 8.1 g/dL 7.2  Total Bilirubin 0.2 - 1.2 mg/dL 0.9  Alkaline Phos 33 - 130 U/L 42  AST 10 - 35 U/L 16  ALT 6 - 29 U/L 11   CBC Latest Ref Rng & Units 01/28/2016  WBC 3.8 - 10.8 K/uL 5.2  Hemoglobin 11.7 - 15.5 g/dL 14.0  Hematocrit 35.0 - 45.0 % 40.9  Platelets 140 - 400 K/uL 197    No images are attached to the encounter.  No results found.   Assessment and plan- Patient is a 57 y.o. female ***    Visit Diagnosis No diagnosis found.   Dr. Randa Evens, MD, MPH [place] Pager- [phone] 08/02/2017 10:34 AM

## 2017-08-05 ENCOUNTER — Inpatient Hospital Stay: Payer: BLUE CROSS/BLUE SHIELD

## 2017-08-05 ENCOUNTER — Inpatient Hospital Stay: Payer: BLUE CROSS/BLUE SHIELD | Attending: Oncology | Admitting: Oncology

## 2017-08-06 ENCOUNTER — Other Ambulatory Visit: Payer: BLUE CROSS/BLUE SHIELD

## 2017-08-19 ENCOUNTER — Encounter: Payer: Self-pay | Admitting: *Deleted

## 2017-08-25 ENCOUNTER — Telehealth: Payer: Self-pay | Admitting: *Deleted

## 2017-08-25 NOTE — Telephone Encounter (Signed)
Sent a certified letter closing chart to ur services for patient that missed several visits

## 2017-09-01 LAB — HM MAMMOGRAPHY

## 2018-01-28 ENCOUNTER — Ambulatory Visit: Payer: Self-pay | Admitting: Family Medicine

## 2018-01-28 ENCOUNTER — Ambulatory Visit: Payer: BLUE CROSS/BLUE SHIELD | Admitting: Nurse Practitioner

## 2018-01-28 ENCOUNTER — Encounter: Payer: Self-pay | Admitting: Nurse Practitioner

## 2018-01-28 VITALS — BP 130/78 | HR 90 | Temp 98.0°F | Resp 16 | Ht 66.5 in | Wt 165.4 lb

## 2018-01-28 DIAGNOSIS — R030 Elevated blood-pressure reading, without diagnosis of hypertension: Secondary | ICD-10-CM | POA: Diagnosis not present

## 2018-01-28 DIAGNOSIS — Z1211 Encounter for screening for malignant neoplasm of colon: Secondary | ICD-10-CM

## 2018-01-28 DIAGNOSIS — R079 Chest pain, unspecified: Secondary | ICD-10-CM

## 2018-01-28 NOTE — Progress Notes (Signed)
Name: Vickie Drake   MRN: [id number]    DOB: Oct 13, 1960   Date:01/28/2018       Progress Note  Subjective  Chief Complaint  Chief Complaint  Patient presents with  . Hypertension    Checked bp this am when she woke up and it was 151/92.    HPI  Patient states started to feel off started having some chest heaviness and states checked her BP and was elevated 151/92- hers usually runs at 112/60.  Chest heaviness- felt like she had a catch in her chest when she layed back down. States heaviness has improved, when she moves her arms around it hurts more. Just feels like a soreness. Has had this previously and it self-resolves. Denies shortness of breath, nausea, radiation. Has been lifting heavy stuff.   Sees cardiologist- states just had a monitor 2 months ago and was negative. States she is bad about drinking water.    States has been gained weight recently; has had increase stress and feels she is working herself too much.  Wt Readings from Last 3 Encounters:  01/28/18 165 lb 6.4 oz (75 kg)  08/07/16 156 lb 8.4 oz (71 kg)  07/29/16 147 lb 12.8 oz (67 kg)   States is overdue for colonoscopy was supposed to follow-up in 3 years in 2012 due to removal of benign polyps.    Patient Active Problem List   Diagnosis Date Noted  . Preventative health care 01/28/2016  . Urine cytology abnormal 07/01/2015  . Elevated ferritin level 02/15/2015  . Adult hypothyroidism 02/11/2015  . Encounter for preventive health examination 02/06/2015  . Left shoulder pain 02/06/2015  . Hematuria, microscopic 02/06/2015  . Left-sided ischial pain 02/06/2015  . Blood glucose elevated 02/06/2015  . Microscopic hematuria 02/06/2015  . Arthralgia of hip 02/06/2015  . Pain in shoulder 02/06/2015  . Fatigue   . Awareness of heartbeats 08/06/2014  . Hyperthyroidism 01/15/2014  . History of tobacco use 08/28/2013    Past Medical History:  Diagnosis Date  . Fall from horse 11/2012   left leg injury  .  Fatigue   . Gross hematuria   . History of panic attacks   . Hypothyroidism   . Vitamin B12 deficiency     Past Surgical History:  Procedure Laterality Date  . ABDOMINAL HYSTERECTOMY    . APPENDECTOMY    . BREAST BIOPSY    . BREAST LUMPECTOMY      Social History   Tobacco Use  . Smoking status: Former Research scientist (life sciences)  . Smokeless tobacco: Never Used  Substance Use Topics  . Alcohol use: Yes    Alcohol/week: 14.0 standard drinks    Types: 14 Glasses of wine per week    Comment: wine only     Current Outpatient Medications:  .  BIOTIN PO, Take by mouth daily. Reported on 07/29/2015, Disp: , Rfl:  .  Cholecalciferol (VITAMIN D PO), Take by mouth daily., Disp: , Rfl:  .  Multiple Vitamin (MULTIVITAMIN) capsule, Take by mouth daily., Disp: , Rfl:  .  vitamin B-12 (CYANOCOBALAMIN) 500 MCG tablet, Take 500 mcg by mouth daily., Disp: , Rfl:  .  vitamin E 1000 UNIT capsule, Take 1,000 Units by mouth daily., Disp: , Rfl:   Allergies  Allergen Reactions  . Cephalexin Other (See Comments)  . Tetracycline Other (See Comments)    Review of Systems  Constitutional: Positive for malaise/fatigue (chronic). Negative for chills and fever.  Eyes: Negative for blurred vision and double vision.  Respiratory: Negative.  Negative for cough and shortness of breath.   Cardiovascular: Positive for chest pain (chest heaviness). Negative for palpitations.  Gastrointestinal: Negative for abdominal pain, diarrhea, heartburn, nausea and vomiting.  Neurological: Negative for dizziness, tingling, focal weakness and headaches.  Psychiatric/Behavioral: The patient is nervous/anxious. The patient does not have insomnia.     No other specific complaints in a complete review of systems (except as listed in HPI above).  Objective  Vitals:   01/28/18 1020  BP: 130/78  Pulse: 90  Resp: 16  Temp: 98 F (36.7 C)  TempSrc: Oral  SpO2: 99%  Weight: 165 lb 6.4 oz (75 kg)  Height: 5' 6.5" (1.689 m)    Body  mass index is 26.3 kg/m.  Nursing Note and Vital Signs reviewed.  Physical Exam  Constitutional: She is oriented to person, place, and time. She appears well-developed and well-nourished.  HENT:  Head: Normocephalic and atraumatic.  Mouth/Throat: Oropharynx is clear and moist.  Eyes: Pupils are equal, round, and reactive to light. Conjunctivae and EOM are normal.  Cardiovascular: Normal rate, regular rhythm and intact distal pulses.  Pulmonary/Chest: Effort normal and breath sounds normal.  Abdominal: Soft. Bowel sounds are normal. She exhibits no distension.  Musculoskeletal: Normal range of motion.  Neurological: She is alert and oriented to person, place, and time.  Skin: Skin is warm and dry. Capillary refill takes less than 2 seconds.  Psychiatric: She has a normal mood and affect. Her behavior is normal. Judgment and thought content normal.    No results found for this or any previous visit (from the past 48 hour(s)).  Assessment & Plan  1. Elevated blood pressure reading Normal in office; patient has increased anxiety about elevated BP; discussed DASH; follow up in one week to ensure BP is good; discussed increased stress and importance of self-care and stress reduction.   2. Chest pain, unspecified type Appears to be more musculoskeletal in origin- Rest, ice/heat, can take aspirin or NSAID short term for pain, discussed ER precautions.  - EKG 12-Lead: normal sinus rhythm; no acute abnormalities  3. Screening for colon cancer  - Ambulatory referral to Gastroenterology

## 2018-01-28 NOTE — Telephone Encounter (Signed)
Patient is calling to report that she was not feeling 'right" this morning and she checked her BP and it was 140/90- she states this is not normal for her and she usually has lower BP. Patient states she has slight headache/dizziness and she has gained weight recently. She also reports her diet has not been the best. Patient given appointment to have her BP checked because her BP monitor is not very reliable and she is having symptoms. She is instructed to get batteries and bring it with her. Reason for Disposition . [7] Systolic BP  >= 001 OR Diastolic >= 80 AND [7] not taking BP medications  Answer Assessment - Initial Assessment Questions 1. BLOOD PRESSURE: "What is the blood pressure?" "Did you take at least two measurements 5 minutes apart?"     140/90 P 91   2. ONSET: "When did you take your blood pressure?"     8:15 3. HOW: "How did you obtain the blood pressure?" (e.g., visiting nurse, automatic home BP monitor)     Automatic cuff 4. HISTORY: "Do you have a history of high blood pressure?"     no 5. MEDICATIONS: "Are you taking any medications for blood pressure?" "Have you missed any doses recently?"     n/a 6. OTHER SYMPTOMS: "Do you have any symptoms?" (e.g., headache, chest pain, blurred vision, difficulty breathing, weakness)     Slight headache, patient states she didn't feel "right" this morning 7. PREGNANCY: "Is there any chance you are pregnant?" "When was your last menstrual period?"     n/a  Protocols used: HIGH BLOOD PRESSURE-A-AH

## 2018-01-28 NOTE — Patient Instructions (Signed)
- Take an aspirin for pain relief - Check blood pressures 3 times when relaxed over the next week; or if having symptoms - Follow-up in one week - Reasons to 911: severe chest pain, unrelieved pain, chest pain that radiates up neck or through back, shortness of breath, severe headache   DASH Eating Plan DASH stands for "Dietary Approaches to Stop Hypertension." The DASH eating plan is a healthy eating plan that has been shown to reduce high blood pressure (hypertension). It may also reduce your risk for type 2 diabetes, heart disease, and stroke. The DASH eating plan may also help with weight loss. What are tips for following this plan? General guidelines  Avoid eating more than 2,300 mg (milligrams) of salt (sodium) a day. If you have hypertension, you may need to reduce your sodium intake to 1,500 mg a day.  Limit alcohol intake to no more than 1 drink a day for nonpregnant women and 2 drinks a day for men. One drink equals 12 oz of beer, 5 oz of wine, or 1 oz of hard liquor.  Work with your health care provider to maintain a healthy body weight or to lose weight. Ask what an ideal weight is for you.  Get at least 30 minutes of exercise that causes your heart to beat faster (aerobic exercise) most days of the week. Activities may include walking, swimming, or biking.  Work with your health care provider or diet and nutrition specialist (dietitian) to adjust your eating plan to your individual calorie needs. Reading food labels  Check food labels for the amount of sodium per serving. Choose foods with less than 5 percent of the Daily Value of sodium. Generally, foods with less than 300 mg of sodium per serving fit into this eating plan.  To find whole grains, look for the word "whole" as the first word in the ingredient list. Shopping  Buy products labeled as "low-sodium" or "no salt added."  Buy fresh foods. Avoid canned foods and premade or frozen meals. Cooking  Avoid adding salt  when cooking. Use salt-free seasonings or herbs instead of table salt or sea salt. Check with your health care provider or pharmacist before using salt substitutes.  Do not fry foods. Cook foods using healthy methods such as baking, boiling, grilling, and broiling instead.  Cook with heart-healthy oils, such as olive, canola, soybean, or sunflower oil. Meal planning   Eat a balanced diet that includes: ? 5 or more servings of fruits and vegetables each day. At each meal, try to fill half of your plate with fruits and vegetables. ? Up to 6-8 servings of whole grains each day. ? Less than 6 oz of lean meat, poultry, or fish each day. A 3-oz serving of meat is about the same size as a deck of cards. One egg equals 1 oz. ? 2 servings of low-fat dairy each day. ? A serving of nuts, seeds, or beans 5 times each week. ? Heart-healthy fats. Healthy fats called Omega-3 fatty acids are found in foods such as flaxseeds and coldwater fish, like sardines, salmon, and mackerel.  Limit how much you eat of the following: ? Canned or prepackaged foods. ? Food that is high in trans fat, such as fried foods. ? Food that is high in saturated fat, such as fatty meat. ? Sweets, desserts, sugary drinks, and other foods with added sugar. ? Full-fat dairy products.  Do not salt foods before eating.  Try to eat at least 2 vegetarian meals  each week.  Eat more home-cooked food and less restaurant, buffet, and fast food.  When eating at a restaurant, ask that your food be prepared with less salt or no salt, if possible. What foods are recommended? The items listed may not be a complete list. Talk with your dietitian about what dietary choices are best for you. Grains Whole-grain or whole-wheat bread. Whole-grain or whole-wheat pasta. Brown rice. [place]. Bulgur. Whole-grain and low-sodium cereals. Pita bread. Low-fat, low-sodium crackers. Whole-wheat flour tortillas. Vegetables Fresh or frozen  vegetables (raw, steamed, roasted, or grilled). Low-sodium or reduced-sodium tomato and vegetable juice. Low-sodium or reduced-sodium tomato sauce and tomato paste. Low-sodium or reduced-sodium canned vegetables. Fruits All fresh, dried, or frozen fruit. Canned fruit in natural juice (without added sugar). Meat and other protein foods Skinless chicken or [place]. Ground chicken or [place]. Pork with fat trimmed off. Fish and seafood. Egg whites. Dried beans, peas, or lentils. Unsalted nuts, nut butters, and seeds. Unsalted canned beans. Lean cuts of beef with fat trimmed off. Low-sodium, lean deli meat. Dairy Low-fat (1%) or fat-free (skim) milk. Fat-free, low-fat, or reduced-fat cheeses. Nonfat, low-sodium ricotta or cottage cheese. Low-fat or nonfat yogurt. Low-fat, low-sodium cheese. Fats and oils Soft margarine without trans fats. Vegetable oil. Low-fat, reduced-fat, or light mayonnaise and salad dressings (reduced-sodium). Canola, safflower, olive, soybean, and sunflower oils. Avocado. Seasoning and other foods Herbs. Spices. Seasoning mixes without salt. Unsalted popcorn and pretzels. Fat-free sweets. What foods are not recommended? The items listed may not be a complete list. Talk with your dietitian about what dietary choices are best for you. Grains Baked goods made with fat, such as croissants, muffins, or some breads. Dry pasta or rice meal packs. Vegetables Creamed or fried vegetables. Vegetables in a cheese sauce. Regular canned vegetables (not low-sodium or reduced-sodium). Regular canned tomato sauce and paste (not low-sodium or reduced-sodium). Regular tomato and vegetable juice (not low-sodium or reduced-sodium). Angie Fava. Olives. Fruits Canned fruit in a light or heavy syrup. Fried fruit. Fruit in cream or butter sauce. Meat and other protein foods Fatty cuts of meat. Ribs. Fried meat. Berniece Salines. Sausage. Bologna and other processed lunch meats. Salami. Fatback. Hotdogs. Bratwurst.  Salted nuts and seeds. Canned beans with added salt. Canned or smoked fish. Whole eggs or egg yolks. Chicken or [place] with skin. Dairy Whole or 2% milk, cream, and half-and-half. Whole or full-fat cream cheese. Whole-fat or sweetened yogurt. Full-fat cheese. Nondairy creamers. Whipped toppings. Processed cheese and cheese spreads. Fats and oils Butter. Stick margarine. Lard. Shortening. Ghee. Bacon fat. Tropical oils, such as coconut, palm kernel, or palm oil. Seasoning and other foods Salted popcorn and pretzels. Onion salt, garlic salt, seasoned salt, table salt, and sea salt. Worcestershire sauce. Tartar sauce. Barbecue sauce. Teriyaki sauce. Soy sauce, including reduced-sodium. Steak sauce. Canned and packaged gravies. Fish sauce. Oyster sauce. Cocktail sauce. Horseradish that you find on the shelf. Ketchup. Mustard. Meat flavorings and tenderizers. Bouillon cubes. Hot sauce and Tabasco sauce. Premade or packaged marinades. Premade or packaged taco seasonings. Relishes. Regular salad dressings. Where to find more information:  National Heart, Lung, and [place]: https://wilson-eaton.com/  American Heart Association: www.heart.org Summary  The DASH eating plan is a healthy eating plan that has been shown to reduce high blood pressure (hypertension). It may also reduce your risk for type 2 diabetes, heart disease, and stroke.  With the DASH eating plan, you should limit salt (sodium) intake to 2,300 mg a day. If you have hypertension, you may need to reduce  your sodium intake to 1,500 mg a day.  When on the DASH eating plan, aim to eat more fresh fruits and vegetables, whole grains, lean proteins, low-fat dairy, and heart-healthy fats.  Work with your health care provider or diet and nutrition specialist (dietitian) to adjust your eating plan to your individual calorie needs. This information is not intended to replace advice given to you by your health care provider. Make sure you discuss any  questions you have with your health care provider. Document Released: 05/07/2011 Document Revised: 05/11/2016 Document Reviewed: 05/11/2016 Elsevier Interactive Patient Education  [employer].

## 2018-01-28 NOTE — Telephone Encounter (Signed)
Reviewed. Seen in office.

## 2018-02-03 ENCOUNTER — Ambulatory Visit: Payer: BLUE CROSS/BLUE SHIELD | Admitting: Nurse Practitioner

## 2018-02-03 ENCOUNTER — Encounter: Payer: Self-pay | Admitting: Nurse Practitioner

## 2018-02-03 VITALS — BP 120/66 | HR 78 | Temp 97.6°F | Resp 16 | Ht 66.5 in | Wt 163.9 lb

## 2018-02-03 DIAGNOSIS — Z23 Encounter for immunization: Secondary | ICD-10-CM | POA: Diagnosis not present

## 2018-02-03 DIAGNOSIS — R030 Elevated blood-pressure reading, without diagnosis of hypertension: Secondary | ICD-10-CM | POA: Diagnosis not present

## 2018-02-03 DIAGNOSIS — R0789 Other chest pain: Secondary | ICD-10-CM

## 2018-02-03 NOTE — Patient Instructions (Signed)
Great work so far! Keep it up and let us know how we can help.

## 2018-02-03 NOTE — Progress Notes (Signed)
Name: Vickie Drake   MRN: [id number]    DOB: Apr 10, 1961   Date:02/03/2018       Progress Note  Subjective  Chief Complaint  Chief Complaint  Patient presents with  . Hypertension    follow up    HPI  Patient presents for blood pressure follow-up. She came in last week due to feeling ill and noticing blood pressure at home was in the 150's. Blood pressure in office was 130/78 and discussed DASH and stress reduction at that time. Since last visit patient has checked her blood pressure on occasion and it has been around 120/76-  133/86  States since last appointment has been eating only healthy foods- vegan except for eggs. Decreased anxiety and is feeling good.   Wt Readings from Last 3 Encounters:  02/03/18 163 lb 14.4 oz (74.3 kg)  01/28/18 165 lb 6.4 oz (75 kg)  08/07/16 156 lb 8.4 oz (71 kg)   Had mammogram done recently in [place], Is due for physical and tetanus. Patient will wait for flu shot.    Patient Active Problem List   Diagnosis Date Noted  . Preventative health care 01/28/2016  . Urine cytology abnormal 07/01/2015  . Elevated ferritin level 02/15/2015  . Adult hypothyroidism 02/11/2015  . Encounter for preventive health examination 02/06/2015  . Left shoulder pain 02/06/2015  . Hematuria, microscopic 02/06/2015  . Left-sided ischial pain 02/06/2015  . Blood glucose elevated 02/06/2015  . Microscopic hematuria 02/06/2015  . Arthralgia of hip 02/06/2015  . Pain in shoulder 02/06/2015  . Fatigue   . Awareness of heartbeats 08/06/2014  . Hyperthyroidism 01/15/2014  . History of tobacco use 08/28/2013    Past Medical History:  Diagnosis Date  . Fall from horse 11/2012   left leg injury  . Fatigue   . Gross hematuria   . History of panic attacks   . Hypothyroidism   . Vitamin B12 deficiency     Past Surgical History:  Procedure Laterality Date  . ABDOMINAL HYSTERECTOMY    . APPENDECTOMY    . BREAST BIOPSY    . BREAST LUMPECTOMY      Social  History   Tobacco Use  . Smoking status: Former Research scientist (life sciences)  . Smokeless tobacco: Never Used  Substance Use Topics  . Alcohol use: Yes    Alcohol/week: 14.0 standard drinks    Types: 14 Glasses of wine per week    Comment: wine only     Current Outpatient Medications:  .  BIOTIN PO, Take by mouth daily. Reported on 07/29/2015, Disp: , Rfl:  .  Cholecalciferol (VITAMIN D PO), Take by mouth daily., Disp: , Rfl:  .  Multiple Vitamin (MULTIVITAMIN) capsule, Take by mouth daily., Disp: , Rfl:  .  vitamin B-12 (CYANOCOBALAMIN) 500 MCG tablet, Take 500 mcg by mouth daily., Disp: , Rfl:  .  vitamin E 1000 UNIT capsule, Take 1,000 Units by mouth daily., Disp: , Rfl:   Allergies  Allergen Reactions  . Cephalexin Other (See Comments)  . Tetracycline Other (See Comments)    Review of Systems  Constitutional: Negative for chills and fever.  Eyes: Negative for blurred vision and double vision.  Respiratory: Negative for shortness of breath.   Cardiovascular: Negative for chest pain and palpitations.  Gastrointestinal: Negative for abdominal pain, nausea and vomiting.  Neurological: Negative for dizziness and headaches.  Psychiatric/Behavioral: The patient is not nervous/anxious and does not have insomnia.      No other specific complaints in a complete review  of systems (except as listed in HPI above).  Objective  Vitals:   02/03/18 0910  BP: 120/66  Pulse: 78  Resp: 16  Temp: 97.6 F (36.4 C)  TempSrc: Oral  SpO2: 97%  Weight: 163 lb 14.4 oz (74.3 kg)  Height: 5' 6.5" (1.689 m)     Body mass index is 26.06 kg/m.  Nursing Note and Vital Signs reviewed.  Physical Exam  Constitutional: She is oriented to person, place, and time. She appears well-developed and well-nourished.  HENT:  Head: Normocephalic and atraumatic.  Eyes: Conjunctivae are normal.  Cardiovascular: Normal rate, regular rhythm, normal heart sounds and intact distal pulses.  Pulmonary/Chest: Effort normal  and breath sounds normal.  Neurological: She is alert and oriented to person, place, and time. Coordination normal.  Skin: Skin is warm and dry. She is not diaphoretic. No erythema.  Psychiatric: She has a normal mood and affect. Her behavior is normal. Judgment and thought content normal.       No results found for this or any previous visit (from the past 48 hour(s)).  Assessment & Plan 1. Chest pressure Resolved; discussed ER precautions likely due to anxiety   2. Elevated blood pressure reading Normal today; encouraged healthy eating and lifestyle  3. Need for Tdap vaccination - Tdap vaccine greater than or equal to 7yo IM     -Red flags and when to present for emergency care or RTC including fever >101.47F, chest pain, shortness of breath, new/worsening/un-resolving symptoms, reviewed with patient at time of visit. Follow up and care instructions discussed and provided in AVS. -Reviewed Health Maintenance: tetanus updated

## 2018-02-22 ENCOUNTER — Ambulatory Visit: Payer: BLUE CROSS/BLUE SHIELD | Admitting: Family Medicine

## 2018-02-22 ENCOUNTER — Encounter: Payer: Self-pay | Admitting: Family Medicine

## 2018-02-22 VITALS — BP 118/82 | HR 84 | Temp 98.2°F | Ht 65.0 in | Wt 164.7 lb

## 2018-02-22 DIAGNOSIS — R739 Hyperglycemia, unspecified: Secondary | ICD-10-CM | POA: Diagnosis not present

## 2018-02-22 DIAGNOSIS — E039 Hypothyroidism, unspecified: Secondary | ICD-10-CM | POA: Diagnosis not present

## 2018-02-22 DIAGNOSIS — R5383 Other fatigue: Secondary | ICD-10-CM

## 2018-02-22 DIAGNOSIS — M129 Arthropathy, unspecified: Secondary | ICD-10-CM | POA: Insufficient documentation

## 2018-02-22 DIAGNOSIS — R7989 Other specified abnormal findings of blood chemistry: Secondary | ICD-10-CM

## 2018-02-22 DIAGNOSIS — R030 Elevated blood-pressure reading, without diagnosis of hypertension: Secondary | ICD-10-CM | POA: Diagnosis not present

## 2018-02-22 DIAGNOSIS — Z0000 Encounter for general adult medical examination without abnormal findings: Secondary | ICD-10-CM

## 2018-02-22 NOTE — Assessment & Plan Note (Signed)
Recheck today. 

## 2018-02-22 NOTE — Assessment & Plan Note (Signed)
r/o RA with base of thumbs, likely OA

## 2018-02-22 NOTE — Progress Notes (Signed)
BP 118/82   Pulse 84   Temp 98.2 F (36.8 C) (Oral)   Ht 5\' 5"  (1.651 m)   Wt 164 lb 11.2 oz (74.7 kg)   SpO2 99%   BMI 27.41 kg/m    Subjective:    Patient ID: Vickie Drake, female    DOB: July 06, 1960, 57 y.o.   MRN: [id number]  HPI: Vickie Drake is a 57 y.o. female  Chief Complaint  Patient presents with  . Hypertension  . 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 She came in here; BP was better She had tightness in her chest and they did an EKG and it was fine Started to eat healthy again BP range 124-138 on top, 80-88 on the bottom HTN runs in the family; but she has never had an issue with it  Pulses running 77-90 Fatigue Pain in the hips and all over, restless nights; she gets pretty good sleep, but gets up at 3 or 4 am, then back to sleep at 6 am, then back up to go to work Nothing in particular wakes  She had the high iron; went to hematologist and just got tired of going to doctors so never went back Thyroid has been abnormal; hair is brittle and thin; not really new; no change in the bowels; skin stays dry; poor energy level; gaining weight but "that is my fault"; she is going through comfort food cravings; no dry mouth  Arthritis in hands and neck; hard physical job  Depression screen [facility] 2/9 01/28/2018 07/29/2016 07/13/2016 07/07/2016 01/28/2016  Decreased Interest 0 0 0 0 0  Down, Depressed, Hopeless 0 0 0 0 0  PHQ - 2 Score 0 0 0 0 0   Fall Risk  01/28/2018 07/29/2016 07/13/2016 07/07/2016 01/28/2016  Falls in the past year? No Yes No No Yes  Number falls in past yr: - 1 - - 1  Injury with Fall? - Yes - - Yes  Comment - concussion - - hit head    Relevant past medical, surgical, family and social history reviewed Past Medical History:  Diagnosis Date  .  Fall from horse 11/2012   left leg injury  . Fatigue   . Gross hematuria   . History of panic attacks   . Hypothyroidism   . Vitamin B12 deficiency    Past Surgical History:  Procedure Laterality Date  . ABDOMINAL HYSTERECTOMY    . APPENDECTOMY    . BREAST BIOPSY    . BREAST LUMPECTOMY     Family History  Problem Relation Age of Onset  . Diabetes Mother   . Cancer Mother        breast  . Heart disease Father   . Heart attack Father   . Hypertension Father   . Diabetes Sister   . Diabetes Brother    Social History   Tobacco Use  . Smoking status: Former Research scientist (life sciences)  . Smokeless tobacco: Never Used  Substance Use Topics  . Alcohol use: Yes    Alcohol/week: 14.0 standard drinks    Types: 14 Glasses of wine per week    Comment: wine only  . Drug use: No   MD note: drinks 2.5 to 3 drinks a day (explained recommendation  1 drink a day)  Interim medical history since last visit reviewed. Allergies and medications reviewed  Review of Systems Per HPI unless specifically indicated above     Objective:    BP 118/82   Pulse 84   Temp 98.2 F (36.8 C) (Oral)   Ht 5\' 5"  (1.651 m)   Wt 164 lb 11.2 oz (74.7 kg)   SpO2 99%   BMI 27.41 kg/m   Wt Readings from Last 3 Encounters:  02/22/18 164 lb 11.2 oz (74.7 kg)  02/03/18 163 lb 14.4 oz (74.3 kg)  01/28/18 165 lb 6.4 oz (75 kg)    Physical Exam  Constitutional: She appears well-developed and well-nourished. No distress.  HENT:  Head: Normocephalic and atraumatic.  Eyes: EOM are normal. No scleral icterus.  Neck: No thyromegaly present.  Cardiovascular: Normal rate, regular rhythm and normal heart sounds.  No murmur heard. Pulmonary/Chest: Effort normal and breath sounds normal. No respiratory distress. She has no wheezes.  Abdominal: Soft. Bowel sounds are normal. She exhibits no distension.  Musculoskeletal: She exhibits no edema.       Right knee: She exhibits deformity (swelling along lateral aspect below joint  line).       Left hand: She exhibits tenderness, bony tenderness and deformity.       Hands: Tenderness along base of the LEFT thumb; enlargement of PIP of the middle finger LEFT hand  Neurological: She is alert.  Skin: Skin is warm and dry. She is not diaphoretic. No pallor.  Psychiatric: She has a normal mood and affect. Her behavior is normal. Judgment and thought content normal.    Results for orders placed or performed in visit on 08/10/16  HM PAP SMEAR  Result Value Ref Range   HM Pap smear normal       Assessment & Plan:   Problem List Items Addressed This Visit      Endocrine   Adult hypothyroidism    Recheck TSH and free T4      Relevant Orders   T4, free     Musculoskeletal and Integument   Arthritis, multiple joint involvement    r/o RA with base of thumbs, likely OA      Relevant Orders   VITAMIN D 25 Hydroxy (Vit-D Deficiency, Fractures)   ANA,IFA RA Diag Pnl w/rflx Tit/Patn     Other   Preventative health care   Relevant Orders   CBC with Differential/Platelet   COMPLETE METABOLIC PANEL WITH GFR   Lipid panel   TSH   Fatigue    Check vit B12 and CBC and patient requested vit D too      Relevant Orders   Vitamin B12   VITAMIN D 25 Hydroxy (Vit-D Deficiency, Fractures)   Elevated ferritin level    Recheck today      Relevant Orders   Iron, TIBC and Ferritin Panel   Blood glucose elevated    Check A1c and glucose       Other Visit Diagnoses    Elevated blood pressure reading    -  Primary   without dx of HTN; she wishes to try DASH guidelines rather than low dose medicine; monitor; cut back on alcohol, etc   Hyperglycemia       check glucose and A1c   Relevant Orders   Hemoglobin A1c       Follow up plan: Return in about 3 weeks (around 03/15/2018) for complete physical with Dr. Sanda Klein.  An after-visit summary was printed and  given to the patient at [place].  Please see the patient instructions which may contain other information  and recommendations beyond what is mentioned above in the assessment and plan.  No orders of the defined types were placed in this encounter.   Orders Placed This Encounter  Procedures  . CBC with Differential/Platelet  . COMPLETE METABOLIC PANEL WITH GFR  . Lipid panel  . TSH  . T4, free  . Vitamin B12  . Iron, TIBC and Ferritin Panel  . VITAMIN D 25 Hydroxy (Vit-D Deficiency, Fractures)  . ANA,IFA RA Diag Pnl w/rflx Tit/Patn  . Hemoglobin A1c

## 2018-02-22 NOTE — Assessment & Plan Note (Addendum)
Check vit B12 and CBC and patient requested vit D too

## 2018-02-22 NOTE — Assessment & Plan Note (Signed)
Check A1c and glucose 

## 2018-02-22 NOTE — Patient Instructions (Addendum)
Take 3 mg of melatonin for just 3 weeks at the exact same time of night  Try to follow the DASH guidelines (DASH stands for Dietary Approaches to Stop Hypertension). Try to limit the sodium in your diet to no more than 1,500mg  of sodium per day. Certainly try to not exceed 2,000 mg per day at the very most. Do not add salt when cooking or at the table.  Check the sodium amount on labels when shopping, and choose items lower in sodium when given a choice. Avoid or limit foods that already contain a lot of sodium. Eat a diet rich in fruits and vegetables and whole grains, and try to lose weight if overweight or obese   DASH Eating Plan DASH stands for "Dietary Approaches to Stop Hypertension." The DASH eating plan is a healthy eating plan that has been shown to reduce high blood pressure (hypertension). It may also reduce your risk for type 2 diabetes, heart disease, and stroke. The DASH eating plan may also help with weight loss. What are tips for following this plan? General guidelines  Avoid eating more than 2,300 mg (milligrams) of salt (sodium) a day. If you have hypertension, you may need to reduce your sodium intake to 1,500 mg a day.  Limit alcohol intake to no more than 1 drink a day for nonpregnant women and 2 drinks a day for men. One drink equals 12 oz of beer, 5 oz of wine, or 1 oz of hard liquor.  Work with your health care provider to maintain a healthy body weight or to lose weight. Ask what an ideal weight is for you.  Get at least 30 minutes of exercise that causes your heart to beat faster (aerobic exercise) most days of the week. Activities may include walking, swimming, or biking.  Work with your health care provider or diet and nutrition specialist (dietitian) to adjust your eating plan to your individual calorie needs. Reading food labels  Check food labels for the amount of sodium per serving. Choose foods with less than 5 percent of the Daily Value of sodium. Generally,  foods with less than 300 mg of sodium per serving fit into this eating plan.  To find whole grains, look for the word "whole" as the first word in the ingredient list. Shopping  Buy products labeled as "low-sodium" or "no salt added."  Buy fresh foods. Avoid canned foods and premade or frozen meals. Cooking  Avoid adding salt when cooking. Use salt-free seasonings or herbs instead of table salt or sea salt. Check with your health care provider or pharmacist before using salt substitutes.  Do not fry foods. Cook foods using healthy methods such as baking, boiling, grilling, and broiling instead.  Cook with heart-healthy oils, such as olive, canola, soybean, or sunflower oil. Meal planning   Eat a balanced diet that includes: ? 5 or more servings of fruits and vegetables each day. At each meal, try to fill half of your plate with fruits and vegetables. ? Up to 6-8 servings of whole grains each day. ? Less than 6 oz of lean meat, poultry, or fish each day. A 3-oz serving of meat is about the same size as a deck of cards. One egg equals 1 oz. ? 2 servings of low-fat dairy each day. ? A serving of nuts, seeds, or beans 5 times each week. ? Heart-healthy fats. Healthy fats called Omega-3 fatty acids are found in foods such as flaxseeds and coldwater fish, like sardines, salmon, and  mackerel.  Limit how much you eat of the following: ? Canned or prepackaged foods. ? Food that is high in trans fat, such as fried foods. ? Food that is high in saturated fat, such as fatty meat. ? Sweets, desserts, sugary drinks, and other foods with added sugar. ? Full-fat dairy products.  Do not salt foods before eating.  Try to eat at least 2 vegetarian meals each week.  Eat more home-cooked food and less restaurant, buffet, and fast food.  When eating at a restaurant, ask that your food be prepared with less salt or no salt, if possible. What foods are recommended? The items listed may not be a  complete list. Talk with your dietitian about what dietary choices are best for you. Grains Whole-grain or whole-wheat bread. Whole-grain or whole-wheat pasta. Brown rice. [place]. Bulgur. Whole-grain and low-sodium cereals. Pita bread. Low-fat, low-sodium crackers. Whole-wheat flour tortillas. Vegetables Fresh or frozen vegetables (raw, steamed, roasted, or grilled). Low-sodium or reduced-sodium tomato and vegetable juice. Low-sodium or reduced-sodium tomato sauce and tomato paste. Low-sodium or reduced-sodium canned vegetables. Fruits All fresh, dried, or frozen fruit. Canned fruit in natural juice (without added sugar). Meat and other protein foods Skinless chicken or [place]. Ground chicken or [place]. Pork with fat trimmed off. Fish and seafood. Egg whites. Dried beans, peas, or lentils. Unsalted nuts, nut butters, and seeds. Unsalted canned beans. Lean cuts of beef with fat trimmed off. Low-sodium, lean deli meat. Dairy Low-fat (1%) or fat-free (skim) milk. Fat-free, low-fat, or reduced-fat cheeses. Nonfat, low-sodium ricotta or cottage cheese. Low-fat or nonfat yogurt. Low-fat, low-sodium cheese. Fats and oils Soft margarine without trans fats. Vegetable oil. Low-fat, reduced-fat, or light mayonnaise and salad dressings (reduced-sodium). Canola, safflower, olive, soybean, and sunflower oils. Avocado. Seasoning and other foods Herbs. Spices. Seasoning mixes without salt. Unsalted popcorn and pretzels. Fat-free sweets. What foods are not recommended? The items listed may not be a complete list. Talk with your dietitian about what dietary choices are best for you. Grains Baked goods made with fat, such as croissants, muffins, or some breads. Dry pasta or rice meal packs. Vegetables Creamed or fried vegetables. Vegetables in a cheese sauce. Regular canned vegetables (not low-sodium or reduced-sodium). Regular canned tomato sauce and paste (not low-sodium or reduced-sodium). Regular  tomato and vegetable juice (not low-sodium or reduced-sodium). Angie Fava. Olives. Fruits Canned fruit in a light or heavy syrup. Fried fruit. Fruit in cream or butter sauce. Meat and other protein foods Fatty cuts of meat. Ribs. Fried meat. Berniece Salines. Sausage. Bologna and other processed lunch meats. Salami. Fatback. Hotdogs. Bratwurst. Salted nuts and seeds. Canned beans with added salt. Canned or smoked fish. Whole eggs or egg yolks. Chicken or [place] with skin. Dairy Whole or 2% milk, cream, and half-and-half. Whole or full-fat cream cheese. Whole-fat or sweetened yogurt. Full-fat cheese. Nondairy creamers. Whipped toppings. Processed cheese and cheese spreads. Fats and oils Butter. Stick margarine. Lard. Shortening. Ghee. Bacon fat. Tropical oils, such as coconut, palm kernel, or palm oil. Seasoning and other foods Salted popcorn and pretzels. Onion salt, garlic salt, seasoned salt, table salt, and sea salt. Worcestershire sauce. Tartar sauce. Barbecue sauce. Teriyaki sauce. Soy sauce, including reduced-sodium. Steak sauce. Canned and packaged gravies. Fish sauce. Oyster sauce. Cocktail sauce. Horseradish that you find on the shelf. Ketchup. Mustard. Meat flavorings and tenderizers. Bouillon cubes. Hot sauce and Tabasco sauce. Premade or packaged marinades. Premade or packaged taco seasonings. Relishes. Regular salad dressings. Where to find more information:  National Heart, Lung,  and Blood Institute: https://wilson-eaton.com/  American Heart Association: www.heart.org Summary  The DASH eating plan is a healthy eating plan that has been shown to reduce high blood pressure (hypertension). It may also reduce your risk for type 2 diabetes, heart disease, and stroke.  With the DASH eating plan, you should limit salt (sodium) intake to 2,300 mg a day. If you have hypertension, you may need to reduce your sodium intake to 1,500 mg a day.  When on the DASH eating plan, aim to eat more fresh fruits and  vegetables, whole grains, lean proteins, low-fat dairy, and heart-healthy fats.  Work with your health care provider or diet and nutrition specialist (dietitian) to adjust your eating plan to your individual calorie needs. This information is not intended to replace advice given to you by your health care provider. Make sure you discuss any questions you have with your health care provider. Document Released: 05/07/2011 Document Revised: 05/11/2016 Document Reviewed: 05/11/2016 Elsevier Interactive Patient Education  2018 [employer].   What You Need To Know About Alcohol Abuse and Dependence, Adult Alcohol is a widely available drug. People who use alcohol will consume it in varying amounts. People who drink alcohol in excess, and have behavior problems during and after drinking alcohol, may have what is called an alcohol use disorder. Alcohol abuse and alcohol dependence are the two main types of alcohol use disorders:  Alcohol abuse is when you use alcohol too much or too often. You may use alcohol to make yourself feel happy or to reduce stress, but you may have a hard time setting a limit on the amount you drink.  Alcohol dependence is when you use alcohol excessively for a period of time, and your body and brain chemistry changes as a result. This can make it hard to stop drinking because you may start to feel sick or feel different when you do not use alcohol.  How can alcohol abuse and dependence affect me? Alcohol abuse and dependence can have a negative effect on your life. Excessive use of alcohol may lead to an addiction. You may feel like you need alcohol to function normally. You may drink alcohol before work in the morning, during the day, or as soon as you get home from work in the evening. These actions can result in:  Poor performance at work.  Losing your job.  Financial problems.  Car crashes or criminal charges from driving after drinking alcohol.  Problems in your  relationships with friends and family.  Losing the trust and respect of co-workers, friends, and family.  Drinking heavily over a long period of time can permanently damage your body and brain, and can cause lifelong health issues, such as:  Liver disease.  Heart problems, high blood pressure, or stroke.  Damage to your pancreas.  Certain cancers.  Decreased ability to fight infections.  Numbness or tingling in hands or feet (neuropathy).  Brain damage.  Depression.  Early (premature) death.  When your body craves alcohol, it is easy to drink more than your body can handle. As a result, you may overdose. Alcohol overdose is a serious situation that requires hospitalization. It may lead to permanent injuries or death. What are the benefits of avoiding alcohol use? Limiting or avoiding alcohol can help you:  Avoid risks to your body, brain, and relationships.  Avoid the risk of abusing or becoming dependent on alcohol.  Keep your mind and body healthy. As a result, you may be more likely to accomplish  your life goals.  Avoid permanent injury, organ damage, or death due to alcohol use.  What steps can I take to stop drinking?  The best way to avoid alcohol abuse, dependence, and addiction is not to drink at all, or to drink measured amounts. Measured drinking means no more than 1 drink a day for nonpregnant women and 2 drinks a day for men. One drink equals 12 oz of beer, 5 oz of wine, or 1 oz of hard liquor.  Stop drinking if you have been drinking too much. This can be very hard to do if you are used to abusing alcohol. If you find it hard to stop drinking, talk about your experience with someone you trust. This person may be able to help you change your drinking behavior.  Instead of drinking alcohol, do something else, like a hobby or exercise.  Find healthy ways to cope with stress, such as exercise, meditation, or spending time with people you care about.  In social  gatherings and places where there may be alcohol, make intentional choices to drink non-alcohol beverages.  If your family, co-workers, or friends drink, talk to them about supporting you in your efforts to stop drinking. Ask them not to drink around you. Spend more time with people who do not drink alcohol.  If you think that you have an alcohol dependency problem: ? Tell friends or family about your concerns. ? Talk with your health care provider or another health professional about where to get help. ? Work with a Transport planner and a Regulatory affairs officer. ? Consider joining a support group for people who struggle with alcohol abuse, dependence, and addiction. Where to find support: You can get support for preventing alcohol abuse, dependence, and addiction from:  Your health care provider.  Alcoholics Anonymous (AA): NicTax.com.pt  SMART Recovery: www.smartrecovery.org  Local treatment centers or chemical dependency counselors.  Where to find more information: Learn more about alcohol abuse and dependence from:  Centers for Disease Control and Prevention: GoalForum.com.au  [employer] on Alcohol Abuse and Alcoholism: https://clark.org/  Local AA groups in your community.  Contact a health care provider if:  You drink more or for longer than you intended, on more than one occasion.  You tried to stop drinking or to cut back on how much you drink, but you were not able to.  You often drink to the point of vomiting or passing out.  You want to drink so badly that you cannot think about anything else.  Drinking has created problems in your life, but you continue to drink.  You keep drinking even though you feel anxious, depressed, or have experienced memory loss.  You have stopped doing the things you used to enjoy in order to drink.  You have to drink more than you used to in order to get  the effect you want.  You experience anxiety, sweating, nausea, shakiness, and trouble sleeping when you try to stop drinking.  You have thoughts about hurting yourself or others. If you ever feel like you may hurt yourself or others, or have thoughts about taking your own life, get help right away. You can go to your nearest emergency department or call:  Your local emergency services (911 in the [place]  A suicide crisis helpline, such as the [facility] at 629 474 7533. This is open 24 hours a day.  Summary  Alcohol is a widely available drug. Misusing, abusing, and becoming dependent on alcohol can cause many problems.  It is important to measure and limit the amount of alcohol you consume. It is recommended to limit alcohol use to 1 drink a day for nonpregnant women and 2 drinks a day for men.  The risks associated with drinking too much will have a direct negative impact on your work, relationships, and health.  If you realize that you are having some challenges keeping your drinking under control, find some ways to change your behavior. Hobbies, self calming activities, exercise, or support groups can help.  If you feel you need help with changing your drinking habits, talk with your health care provider, a good friend, or a therapist, or go to an [facility] group. This information is not intended to replace advice given to you by your health care provider. Make sure you discuss any questions you have with your health care provider. Document Released: 05/12/2016 Document Revised: 05/12/2016 Document Reviewed: 05/12/2016 Elsevier Interactive Patient Education  [employer].

## 2018-02-22 NOTE — Assessment & Plan Note (Signed)
Recheck TSH and free T4 

## 2018-02-24 ENCOUNTER — Encounter: Payer: Self-pay | Admitting: Family Medicine

## 2018-02-24 LAB — COMPLETE METABOLIC PANEL WITH GFR
AG RATIO: 2.1 (calc) (ref 1.0–2.5)
ALT: 15 U/L (ref 6–29)
AST: 18 U/L (ref 10–35)
Albumin: 4.6 g/dL (ref 3.6–5.1)
Alkaline phosphatase (APISO): 47 U/L (ref 33–130)
BUN: 16 mg/dL (ref 7–25)
CO2: 29 mmol/L (ref 20–32)
CREATININE: 0.59 mg/dL (ref 0.50–1.05)
Calcium: 9.9 mg/dL (ref 8.6–10.4)
Chloride: 103 mmol/L (ref 98–110)
GFR, EST AFRICAN AMERICAN: 117 mL/min/{1.73_m2} (ref >=60)
GFR, Est Non African American: 101 mL/min/{1.73_m2} (ref >=60)
GLOBULIN: 2.2 g/dL (ref 1.9–3.7)
Glucose, Bld: 93 mg/dL (ref 65–99)
Potassium: 4 mmol/L (ref 3.5–5.3)
SODIUM: 139 mmol/L (ref 135–146)
TOTAL PROTEIN: 6.8 g/dL (ref 6.1–8.1)
Total Bilirubin: 0.6 mg/dL (ref 0.2–1.2)

## 2018-02-24 LAB — TSH: TSH: 3.42 m[IU]/L (ref 0.40–4.50)

## 2018-02-24 LAB — ANTI-NUCLEAR AB-TITER (ANA TITER)

## 2018-02-24 LAB — CBC WITH DIFFERENTIAL/PLATELET
BASOS PCT: 0.7 %
Basophils Absolute: 29 cells/uL (ref 0–200)
EOS PCT: 1.9 %
Eosinophils Absolute: 80 cells/uL (ref 15–500)
HEMATOCRIT: 39.5 % (ref 35.0–45.0)
HEMOGLOBIN: 14 g/dL (ref 11.7–15.5)
LYMPHS ABS: 1680 {cells}/uL (ref 850–3900)
MCH: 30.8 pg (ref 27.0–33.0)
MCHC: 35.4 g/dL (ref 32.0–36.0)
MCV: 87 fL (ref 80.0–100.0)
MPV: 9.9 fL (ref 7.5–12.5)
Monocytes Relative: 9.6 %
NEUTROS PCT: 47.8 %
Neutro Abs: 2008 cells/uL (ref 1500–7800)
Platelets: 220 10*3/uL (ref 140–400)
RBC: 4.54 10*6/uL (ref 3.80–5.10)
RDW: 12 % (ref 11.0–15.0)
Total Lymphocyte: 40 %
WBC: 4.2 10*3/uL (ref 3.8–10.8)
WBCMIX: 403 {cells}/uL (ref 200–950)

## 2018-02-24 LAB — LIPID PANEL
CHOL/HDL RATIO: 2.7 (calc) (ref <5.0)
Cholesterol: 204 mg/dL — ABNORMAL HIGH (ref <200)
HDL: 76 mg/dL (ref >50)
LDL Cholesterol (Calc): 111 mg/dL (calc) — ABNORMAL HIGH
NON-HDL CHOLESTEROL (CALC): 128 mg/dL (ref <130)
TRIGLYCERIDES: 84 mg/dL (ref <150)

## 2018-02-24 LAB — HEMOGLOBIN A1C
EAG (MMOL/L): 5.7 (calc)
Hgb A1c MFr Bld: 5.2 % of total Hgb (ref <5.7)
Mean Plasma Glucose: 103 (calc)

## 2018-02-24 LAB — ANA,IFA RA DIAG PNL W/RFLX TIT/PATN
Anti Nuclear Antibody(ANA): POSITIVE — AB
Cyclic Citrullin Peptide Ab: <16 UNITS
Rhuematoid fact SerPl-aCnc: <14 IU/mL (ref <14)

## 2018-02-24 LAB — IRON,TIBC AND FERRITIN PANEL
%SAT: 45 % (calc) (ref 16–45)
FERRITIN: 221 ng/mL (ref 16–232)
Iron: 155 ug/dL (ref 45–160)
TIBC: 344 mcg/dL (calc) (ref 250–450)

## 2018-02-24 LAB — T4, FREE: Free T4: 1 ng/dL (ref 0.8–1.8)

## 2018-02-24 LAB — VITAMIN B12: VITAMIN B 12: 460 pg/mL (ref 200–1100)

## 2018-02-24 LAB — VITAMIN D 25 HYDROXY (VIT D DEFICIENCY, FRACTURES): Vit D, 25-Hydroxy: 36 ng/mL (ref 30–100)

## 2018-02-28 ENCOUNTER — Telehealth: Payer: Self-pay

## 2018-02-28 DIAGNOSIS — R768 Other specified abnormal immunological findings in serum: Secondary | ICD-10-CM

## 2018-02-28 NOTE — Telephone Encounter (Signed)
-----   Message from Arnetha Courser, MD sent at 02/25/2018  4:48 PM EDT ----- Please let pt know that her CBC is normal; CMP is normal; LDL cholesterol is a little higher than ideal but no medicines needed; vitamin B12 is normal; vitamin D is normal; ANA is positive so we'll ADD ON extra labs: please ADD anti-  mitochondrial antibodies and anti-smooth muscle antibodies, dx: positive ANA

## 2018-03-09 ENCOUNTER — Ambulatory Visit (INDEPENDENT_AMBULATORY_CARE_PROVIDER_SITE_OTHER): Payer: BLUE CROSS/BLUE SHIELD

## 2018-03-09 DIAGNOSIS — Z23 Encounter for immunization: Secondary | ICD-10-CM

## 2018-03-13 LAB — MITOCHONDRIAL ANTIBODIES

## 2018-03-13 LAB — ANTI-SMOOTH MUSCLE ANTIBODY, IGG

## 2018-03-21 ENCOUNTER — Telehealth: Payer: Self-pay | Admitting: *Deleted

## 2018-03-21 DIAGNOSIS — R5383 Other fatigue: Secondary | ICD-10-CM

## 2018-03-21 DIAGNOSIS — M129 Arthropathy, unspecified: Secondary | ICD-10-CM

## 2018-03-21 NOTE — Telephone Encounter (Signed)
That's fine; I ordered it and she can have this done at her earliest convenience Thank you

## 2018-03-21 NOTE — Telephone Encounter (Signed)
Reviewed lab results and physician's note with patient. She questions if Lyme disease could be a probable cause for her continued aches, fatigue, sore neck and shoulders. Reported she found several ticks on her skin earlier this summer. Questioned her about any known rashes on her body or fever observed since the tick bites, she denied both.  She would like to be tested for Lyme disease if Dr. Sanda Klein agrees. Please advise.

## 2018-03-21 NOTE — Telephone Encounter (Signed)
Pt.notified

## 2018-03-21 NOTE — Addendum Note (Signed)
Addended by: Ingrid Shifrin, Satira Anis on: 03/21/2018 03:53 PM   Modules accepted: Orders

## 2018-03-22 ENCOUNTER — Encounter: Payer: Self-pay | Admitting: Gastroenterology

## 2018-03-23 ENCOUNTER — Other Ambulatory Visit: Payer: Self-pay

## 2018-03-23 DIAGNOSIS — M129 Arthropathy, unspecified: Secondary | ICD-10-CM

## 2018-03-23 DIAGNOSIS — R5383 Other fatigue: Secondary | ICD-10-CM

## 2018-03-24 ENCOUNTER — Encounter: Payer: BLUE CROSS/BLUE SHIELD | Admitting: Family Medicine

## 2018-03-25 LAB — B. BURGDORFI ANTIBODIES BY WB
B BURGDORFERI IGG ABS (IB): NEGATIVE
B BURGDORFERI IGM ABS (IB): NEGATIVE
LYME DISEASE 30 KD IGG: NONREACTIVE
LYME DISEASE 58 KD IGG: NONREACTIVE
Lyme Disease 18 kD IgG: NONREACTIVE
Lyme Disease 23 kD IgG: NONREACTIVE
Lyme Disease 23 kD IgM: NONREACTIVE
Lyme Disease 28 kD IgG: NONREACTIVE
Lyme Disease 39 kD IgG: NONREACTIVE
Lyme Disease 39 kD IgM: NONREACTIVE
Lyme Disease 41 kD IgG: NONREACTIVE
Lyme Disease 41 kD IgM: NONREACTIVE
Lyme Disease 45 kD IgG: NONREACTIVE
Lyme Disease 66 kD IgG: NONREACTIVE
Lyme Disease 93 kD IgG: NONREACTIVE

## 2018-04-25 ENCOUNTER — Encounter: Payer: Self-pay | Admitting: Family Medicine

## 2018-04-25 ENCOUNTER — Ambulatory Visit (INDEPENDENT_AMBULATORY_CARE_PROVIDER_SITE_OTHER): Payer: BLUE CROSS/BLUE SHIELD | Admitting: Family Medicine

## 2018-04-25 VITALS — BP 120/78 | HR 83 | Temp 98.6°F | Ht 65.0 in | Wt 169.0 lb

## 2018-04-25 DIAGNOSIS — E2839 Other primary ovarian failure: Secondary | ICD-10-CM

## 2018-04-25 DIAGNOSIS — Z0000 Encounter for general adult medical examination without abnormal findings: Secondary | ICD-10-CM | POA: Diagnosis not present

## 2018-04-25 DIAGNOSIS — R7989 Other specified abnormal findings of blood chemistry: Secondary | ICD-10-CM | POA: Insufficient documentation

## 2018-04-25 DIAGNOSIS — R768 Other specified abnormal immunological findings in serum: Secondary | ICD-10-CM

## 2018-04-25 NOTE — Progress Notes (Signed)
Patient ID: Vickie Drake, female   DOB: 1961/05/13, 57 y.o.   MRN: [id number]   Subjective:   Vickie Drake is a 57 y.o. female here for a complete physical exam  Interim issues since last visit: no medical excitement Right toe was cramping on the foot, whole day; turned hot and red, thought it was gout; same thing happened to finger, left thumb She has a positive ANA; aching and hurting Going to give blood every 2 months for the mildly high ferritin  USPSTF grade A and B recommendations Depression:  Depression screen [facility] 2/9 04/25/2018 01/28/2018 07/29/2016 07/13/2016 07/07/2016  Decreased Interest 0 0 0 0 0  Down, Depressed, Hopeless 0 0 0 0 0  PHQ - 2 Score 0 0 0 0 0  Altered sleeping 0 - - - -  Tired, decreased energy 0 - - - -  Change in appetite 0 - - - -  Feeling bad or failure about yourself  0 - - - -  Trouble concentrating 0 - - - -  Moving slowly or fidgety/restless 0 - - - -  Suicidal thoughts 0 - - - -  PHQ-9 Score 0 - - - -  Difficult doing work/chores Not difficult at all - - - -   Hypertension: BP Readings from Last 3 Encounters:  04/25/18 120/78  02/22/18 118/82  02/03/18 120/66   Obesity: she says it's "awful" Wt Readings from Last 3 Encounters:  04/25/18 169 lb (76.7 kg)  02/22/18 164 lb 11.2 oz (74.7 kg)  02/03/18 163 lb 14.4 oz (74.3 kg)   BMI Readings from Last 3 Encounters:  04/25/18 28.12 kg/m  02/22/18 27.41 kg/m  02/03/18 26.06 kg/m    Skin cancer: no worrisome moles Lung cancer:  nonsmoker Breast cancer: April; no new lumps or bumps Colorectal cancer: done 2012; they  Cervical cancer screening: UTD BRCA gene screening: family hx of breast and/or ovarian cancer and/or metastatic prostate cancer? Mother breast cancer (left) at age 54; she is now 61; other cancers in family; does not want genetic testing HIV, hep B, hep C: no need STD testing and prevention (chl/gon/syphilis): no need Intimate partner violence: no abuse Contraception:  n/a Osteoporosis: will order Fall prevention/vitamin D: discussed Immunizations: does not want shingles vaccine right now, flu and td UTD Diet: she went vegan but got tired and had to quit Exercise: she works hard and takes care of two horses, cleaning stalls; son has a gym Alcohol:    Office Visit from 04/25/2018 in [facility]  AUDIT-C Score  4    drinks less than before, 1 glass a day, red wine Tobacco use: nonsmoker AAA: n/a Aspirin: has a heart doctor; sees Dr. Saralyn Pilar; sees him for palpitations; monitors and EKG The 10-year ASCVD risk score Mikey Bussing DC Jr., et al., 2013) is: 2.1%   Values used to calculate the score:     Age: 10 years     Sex: Female     Is Non-Hispanic African American: No     Diabetic: No     Tobacco smoker: No     Systolic Blood Pressure: 585 mmHg     Is BP treated: No     HDL Cholesterol: 76 mg/dL     Total Cholesterol: 204 mg/dL  Glucose:  Glucose, Bld  Date Value Ref Range Status  02/22/2018 93 65 - 99 mg/dL Final    Comment:    .  Fasting reference interval .   07/29/2016 95 65 - 99 mg/dL Final  01/28/2016 108 (H) 65 - 99 mg/dL Final   Lipids:  Lab Results  Component Value Date   CHOL 204 (H) 02/22/2018   CHOL 205 (H) 01/28/2016   Lab Results  Component Value Date   HDL 76 02/22/2018   HDL 91 01/28/2016   Lab Results  Component Value Date   LDLCALC 111 (H) 02/22/2018   LDLCALC 100 01/28/2016   Lab Results  Component Value Date   TRIG 84 02/22/2018   TRIG 69 01/28/2016   Lab Results  Component Value Date   CHOLHDL 2.7 02/22/2018   CHOLHDL 2.3 01/28/2016   No results found for: LDLDIRECT   Past Medical History:  Diagnosis Date  . Fall from horse 11/2012   left leg injury  . Fatigue   . Gross hematuria   . History of panic attacks   . Hypothyroidism   . Vitamin B12 deficiency    Past Surgical History:  Procedure Laterality Date  . ABDOMINAL HYSTERECTOMY    . APPENDECTOMY    . BREAST  BIOPSY    . BREAST LUMPECTOMY     Family History  Problem Relation Age of Onset  . Diabetes Mother   . Cancer Mother        breast  . Heart disease Father   . Heart attack Father   . Hypertension Father   . Diabetes Sister   . Diabetes Brother    Social History   Tobacco Use  . Smoking status: Former Research scientist (life sciences)  . Smokeless tobacco: Never Used  Substance Use Topics  . Alcohol use: Yes    Alcohol/week: 14.0 standard drinks    Types: 14 Glasses of wine per week    Comment: wine only  . Drug use: No   Review of Systems  Objective:   Vitals:   04/25/18 0827  BP: 120/78  Pulse: 83  Temp: 98.6 F (37 C)  SpO2: 98%  Weight: 169 lb (76.7 kg)  Height: _0  (1.651 m)   Body mass index is 28.12 kg/m. Wt Readings from Last 3 Encounters:  04/25/18 169 lb (76.7 kg)  02/22/18 164 lb 11.2 oz (74.7 kg)  02/03/18 163 lb 14.4 oz (74.3 kg)   Physical Exam  Constitutional: She appears well-developed and well-nourished.  HENT:  Right Ear: Hearing, tympanic membrane, external ear and ear canal normal.  Left Ear: Hearing, tympanic membrane, external ear and ear canal normal.  Mouth/Throat: No posterior oropharyngeal edema or posterior oropharyngeal erythema.  Eyes: Conjunctivae and EOM are normal. Right eye exhibits no hordeolum. Left eye exhibits no hordeolum. No scleral icterus.  Neck: Carotid bruit is not present. No thyromegaly present.  Cardiovascular: Normal rate, regular rhythm, S1 normal, S2 normal and normal heart sounds.  No extrasystoles are present.  Pulmonary/Chest: Effort normal and breath sounds normal. No respiratory distress.  Abdominal: Soft. Normal appearance and bowel sounds are normal. She exhibits no distension, no abdominal bruit, no pulsatile midline mass and no mass. There is no hepatosplenomegaly. There is no tenderness. No hernia.  Musculoskeletal: Normal range of motion. She exhibits no edema.  Lymphadenopathy:       Head (right side): No submandibular  adenopathy present.       Head (left side): No submandibular adenopathy present.    She has no cervical adenopathy.  Neurological: She is alert. She displays no tremor. Gait normal.  Reflex Scores:      Patellar reflexes are  2+ on the right side and 2+ on the left side. Skin: Skin is warm and dry. No bruising and no ecchymosis noted. No cyanosis. No pallor.  Psychiatric: Her speech is normal and behavior is normal. Thought content normal. Her mood appears not anxious. She does not exhibit a depressed mood.    Assessment/Plan:   Problem List Items Addressed This Visit      Other   Preventative health care - Primary    USPSTF grade A and B recommendations reviewed with patient; age-appropriate recommendations, preventive care, screening tests, etc discussed and encouraged; healthy living encouraged; see AVS for patient education given to patient       Elevated ferritin    Hx of elevated ferritin, back to normal; patient is donating blood intermittently to help control this; has already seen hematology       Other Visit Diagnoses    Estrogen deficiency       Relevant Orders   DG Bone Density   Positive ANA (antinuclear antibody)       with ongoing discomfort; will refer to rheum; I did explain though that most of these are false positives, but she is symptomatic   Relevant Orders   Ambulatory referral to Rheumatology       No orders of the defined types were placed in this encounter.  Orders Placed This Encounter  Procedures  . DG Bone Density    Order Specific Question:   Reason for Exam (SYMPTOM  OR DIAGNOSIS REQUIRED)    Answer:   estrogen deficiency    Order Specific Question:   Preferred imaging location?    Answer:   [facility]    Order Specific Question:   Is the patient pregnant?    Answer:   No  . Ambulatory referral to Rheumatology    Referral Priority:   Routine    Referral Type:   Consultation    Referral Reason:   Specialty Services Required     Requested Specialty:   Rheumatology    Number of Visits Requested:   1    Follow up plan: Return in about 1 year (around 04/26/2019) for complete physical (or just AFTER).  An After Visit Summary was printed and given to the patient.

## 2018-04-25 NOTE — Patient Instructions (Signed)

## 2018-04-25 NOTE — Assessment & Plan Note (Signed)
Hx of elevated ferritin, back to normal; patient is donating blood intermittently to help control this; has already seen hematology

## 2018-04-25 NOTE — Assessment & Plan Note (Signed)
USPSTF grade A and B recommendations reviewed with patient; age-appropriate recommendations, preventive care, screening tests, etc discussed and encouraged; healthy living encouraged; see AVS for patient education given to patient  

## 2018-05-01 HISTORY — PX: COLONOSCOPY: SHX174

## 2018-05-03 ENCOUNTER — Encounter: Payer: Self-pay | Admitting: Gastroenterology

## 2018-05-03 ENCOUNTER — Ambulatory Visit (AMBULATORY_SURGERY_CENTER): Payer: Self-pay

## 2018-05-03 VITALS — Ht 66.0 in | Wt 171.6 lb

## 2018-05-03 DIAGNOSIS — Z86010 Personal history of colonic polyps: Secondary | ICD-10-CM

## 2018-05-03 MED ORDER — NA SULFATE-K SULFATE-MG SULF 17.5-3.13-1.6 GM/177ML PO SOLN: 1.0000 | Freq: Once | ORAL | 0 refills | Status: AC

## 2018-05-03 NOTE — Progress Notes (Signed)
Per pt, no allergies to soy or egg products.Pt not taking any weight loss meds or using  O2 at home.  Pt refused emmi video. 

## 2018-05-06 ENCOUNTER — Telehealth: Payer: Self-pay

## 2018-05-06 NOTE — Telephone Encounter (Signed)
Pt.notified

## 2018-05-06 NOTE — Telephone Encounter (Signed)
Copied from [place] [id number]. Topic: Quick Communication - See Telephone Encounter >> May 06, 2018  9:11 AM Rayann Heman wrote: CRM for notification. See Telephone encounter for: 05/06/18. Pt called and stated that she has Mitral valve prolapse. Pt stated that she is schedule for colonoscopy Monday and would like to know if she needs to take an antibiotic. Please advise

## 2018-05-06 NOTE — Telephone Encounter (Signed)
From Up-to-Date:  Antibiotics generally not required  Genitourinary and gastrointestinal tracts  We suggest no routine prophylaxis for gastrointestinal (GI) or genitourinary (GU) procedures, even for patients with high-risk cardiac conditions  The risk of bacteremia for invasive GU procedures such as dilation of strictures, insertions of catheters, and prostatectomy is relatively low. The risk of bacteremia for invasive GI procedures such as lower bowel endoscopy with biopsy or endoscopic retrograde cholangiopancreatography is also low.  ---------------------------------------------------------  From what I see, I cannot find that she should have antibiotics prior to a routine colonoscopy I'll recommend that she talk to her gastroenterologist just to be sure

## 2018-05-09 ENCOUNTER — Encounter: Payer: Self-pay | Admitting: Gastroenterology

## 2018-05-09 ENCOUNTER — Ambulatory Visit (AMBULATORY_SURGERY_CENTER): Payer: BLUE CROSS/BLUE SHIELD | Admitting: Gastroenterology

## 2018-05-09 VITALS — BP 104/51 | HR 69 | Temp 96.8°F | Resp 17 | Ht 66.0 in | Wt 171.0 lb

## 2018-05-09 DIAGNOSIS — D120 Benign neoplasm of cecum: Secondary | ICD-10-CM | POA: Diagnosis not present

## 2018-05-09 DIAGNOSIS — Z86010 Personal history of colonic polyps: Secondary | ICD-10-CM | POA: Diagnosis present

## 2018-05-09 DIAGNOSIS — K635 Polyp of colon: Secondary | ICD-10-CM

## 2018-05-09 DIAGNOSIS — D125 Benign neoplasm of sigmoid colon: Secondary | ICD-10-CM | POA: Diagnosis not present

## 2018-05-09 MED ORDER — SODIUM CHLORIDE 0.9 % IV SOLN: 500.0000 mL | Freq: Once | INTRAVENOUS | Status: DC

## 2018-05-09 NOTE — Op Note (Signed)
[place] Patient Name: Vickie Drake Procedure Date: 05/09/2018 7:59 AM MRN: [id number] Endoscopist: Jackquline Denmark , MD Age: 57 Referring MD:  Date of Birth: April 01, 1961 Gender: Female Account #: [id number] Procedure:                Colonoscopy Indications:              High risk colon cancer surveillance: Personal                            history of colonic polyps Medicines:                Monitored Anesthesia Care Procedure:                Pre-Anesthesia Assessment:                           - Prior to the procedure, a History and Physical                            was performed, and patient medications and                            allergies were reviewed. The patient's tolerance of                            previous anesthesia was also reviewed. The risks                            and benefits of the procedure and the sedation                            options and risks were discussed with the patient.                            All questions were answered, and informed consent                            was obtained. Prior Anticoagulants: The patient has                            taken no previous anticoagulant or antiplatelet                            agents. ASA Grade Assessment: I - A normal, healthy                            patient. After reviewing the risks and benefits,                            the patient was deemed in satisfactory condition to                            undergo the procedure.  After obtaining informed consent, the colonoscope                            was passed under direct vision. Throughout the                            procedure, the patient's blood pressure, pulse, and                            oxygen saturations were monitored continuously. The                            Colonoscope was introduced through the anus and                            advanced to the the cecum, identified by                  appendiceal orifice and ileocecal valve. The                            colonoscopy was performed without difficulty. The                            patient tolerated the procedure well. The quality                            of the bowel preparation was excellent. Scope In: 8:03:36 AM Scope Out: 8:22:49 AM Scope Withdrawal Time: 0 hours 13 minutes 5 seconds  Total Procedure Duration: 0 hours 19 minutes 13 seconds  Findings:                 Three sessile polyps were found in the proximal                            sigmoid colon and cecum. The polyps were 4 to 6 mm                            in size. These polyps were removed with a cold                            snare. Resection and retrieval were complete.                            Estimated blood loss: none.                           The exam was otherwise without abnormality on                            direct and retroflexion views. Complications:            No immediate complications. Estimated Blood Loss:     Estimated blood loss: none. Impression:               - Colonic polyps status post polypectomy.                           -  The examination was otherwise normal on direct                            and retroflexion views. Recommendation:           - Patient has a contact number available for                            emergencies. The signs and symptoms of potential                            delayed complications were discussed with the                            patient. Return to normal activities tomorrow.                            Written discharge instructions were provided to the                            patient.                           - Resume previous diet.                           - Continue present medications.                           - Await pathology results.                           - Repeat colonoscopy for surveillance based on                            pathology results.                            - Return to GI office PRN. Jackquline Denmark, MD 05/09/2018 8:28:14 AM This report has been signed electronically.

## 2018-05-09 NOTE — Patient Instructions (Signed)
Discharge instructions given. Handout on polyps. Resume previous medication. YOU HAD AN ENDOSCOPIC PROCEDURE TODAY AT [facility]:   Refer to the procedure report that was given to you for any specific questions about what was found during the examination.  If the procedure report does not answer your questions, please call your gastroenterologist to clarify.  If you requested that your care partner not be given the details of your procedure findings, then the procedure report has been included in a sealed envelope for you to review at your convenience later.  YOU SHOULD EXPECT: Some feelings of bloating in the abdomen. Passage of more gas than usual.  Walking can help get rid of the air that was put into your GI tract during the procedure and reduce the bloating. If you had a lower endoscopy (such as a colonoscopy or flexible sigmoidoscopy) you may notice spotting of blood in your stool or on the toilet paper. If you underwent a bowel prep for your procedure, you may not have a normal bowel movement for a few days.  Please Note:  You might notice some irritation and congestion in your nose or some drainage.  This is from the oxygen used during your procedure.  There is no need for concern and it should clear up in a day or so.  SYMPTOMS TO REPORT IMMEDIATELY:   Following lower endoscopy (colonoscopy or flexible sigmoidoscopy):  Excessive amounts of blood in the stool  Significant tenderness or worsening of abdominal pains  Swelling of the abdomen that is new, acute  Fever of 100F or higher   For urgent or emergent issues, a gastroenterologist can be reached at any hour by calling [phone].   DIET:  We do recommend a small meal at first, but then you may proceed to your regular diet.  Drink plenty of fluids but you should avoid alcoholic beverages for 24 hours.  ACTIVITY:  You should plan to take it easy for the rest of today and you should NOT DRIVE or use heavy  machinery until tomorrow (because of the sedation medicines used during the test).    FOLLOW UP: Our staff will call the number listed on your records the next business day following your procedure to check on you and address any questions or concerns that you may have regarding the information given to you following your procedure. If we do not reach you, we will leave a message.  However, if you are feeling well and you are not experiencing any problems, there is no need to return our call.  We will assume that you have returned to your regular daily activities without incident.  If any biopsies were taken you will be contacted by phone or by letter within the next 1-3 weeks.  Please call us at [phone] if you have not heard about the biopsies in 3 weeks.    SIGNATURES/CONFIDENTIALITY: You and/or your care partner have signed paperwork which will be entered into your electronic medical record.  These signatures attest to the fact that that the information above on your After Visit Summary has been reviewed and is understood.  Full responsibility of the confidentiality of this discharge information lies with you and/or your care-partner.

## 2018-05-09 NOTE — Progress Notes (Signed)
Called to room to assist during endoscopic procedure.  Patient ID and intended procedure confirmed with present staff. Received instructions for my participation in the procedure from the performing physician.  

## 2018-05-09 NOTE — Progress Notes (Signed)
Report given to PACU, vss 

## 2018-05-10 ENCOUNTER — Telehealth: Payer: Self-pay

## 2018-05-10 NOTE — Telephone Encounter (Signed)
  Follow up Call-  Call back number 05/09/2018  Post procedure Call Back phone  # [phone]  Permission to leave phone message Yes  Some recent data might be hidden     Patient questions:  Do you have a fever, pain , or abdominal swelling? No. Pain Score  0 *  Have you tolerated food without any problems? Yes.    Have you been able to return to your normal activities? Yes.    Do you have any questions about your discharge instructions: Diet   No. Medications  No. Follow up visit  No.  Do you have questions or concerns about your Care? No.  Actions: * If pain score is 4 or above: No action needed, pain <4.

## 2018-05-16 ENCOUNTER — Encounter: Payer: Self-pay | Admitting: Gastroenterology

## 2018-05-26 ENCOUNTER — Encounter: Payer: Self-pay | Admitting: Family Medicine

## 2018-05-26 DIAGNOSIS — R768 Other specified abnormal immunological findings in serum: Secondary | ICD-10-CM | POA: Insufficient documentation

## 2018-07-20 ENCOUNTER — Encounter: Payer: Self-pay | Admitting: Nurse Practitioner

## 2018-07-20 ENCOUNTER — Ambulatory Visit: Payer: BLUE CROSS/BLUE SHIELD | Admitting: Nurse Practitioner

## 2018-07-20 VITALS — BP 118/76 | HR 88 | Temp 97.9°F | Resp 16 | Wt 162.2 lb

## 2018-07-20 DIAGNOSIS — R059 Cough, unspecified: Secondary | ICD-10-CM

## 2018-07-20 DIAGNOSIS — R05 Cough: Secondary | ICD-10-CM | POA: Diagnosis not present

## 2018-07-20 DIAGNOSIS — R0602 Shortness of breath: Secondary | ICD-10-CM | POA: Diagnosis not present

## 2018-07-20 DIAGNOSIS — J069 Acute upper respiratory infection, unspecified: Secondary | ICD-10-CM | POA: Diagnosis not present

## 2018-07-20 MED ORDER — BENZONATATE 200 MG PO CAPS: 200.0000 mg | ORAL_CAPSULE | Freq: Two times a day (BID) | ORAL | 1 refills | Status: DC | PRN

## 2018-07-20 MED ORDER — GUAIFENESIN ER 600 MG PO TB12: 600.0000 mg | ORAL_TABLET | Freq: Two times a day (BID) | ORAL | 0 refills | Status: DC

## 2018-07-20 NOTE — Patient Instructions (Addendum)
-   If you have worsening shortness of breath and fatigue or develop fevers and chills with your strong cough please get an chest xray at the imaging center across the street. - Please rest, drink lots of water - You can take guaifenesin 600-1200mg  BID, use humidfied air and take the cough medication to help your recover- it can take up to 2-4 weeks for the cough to completely resolve.   You likely have a upper respiratory infection (URI) or bronchitis. Antibiotics will not reduce the number of days you are ill or prevent you from getting bacterial rhinosinusitis. A URI can take up to 14 days to resolve, but typically last between 7-11 days. Your body is so smart and strong that it will be fighting this illness off for you but it is important that you drink plenty of fluids, rest. Cover your nose/mouth when you cough or sneeze and wash your hands well and often. Here are some helpful things you can use or pick up over the counter from the pharmacy to help with your symptoms:   For Fever/Pain: Acetaminophen every 6 hours as needed (maximum of 3000mg  a day). If you are still uncomfortable you can add ibuprofen OR naproxen  For coughing: try dextromethorphan for a cough suppressant, and/or a cool mist humidifier, lozenges  For sore throat: saline gargles, honey herbal tea, lozenges, throat spray  To dry out your nose: try an antihistamine like loratadine (non-sedating) or diphenhydramine (sedating) or others To relieve a stuffy nose: try an oral decongestant  Like pseudoephedrine if you are under the age of 24 and do not have high blood pressure, neti pot To make blowing your nose easier and relieve chest congestion: guaifenesin 400mg  every 4-6 hours of guaifenesin ER [phone] mg every 12 hours. Do not take more than 2,400mg  a day.

## 2018-07-20 NOTE — Progress Notes (Signed)
Name: Vickie Drake   MRN: [id number]    DOB: 06/04/1960   Date:07/20/2018       Progress Note  Subjective  Chief Complaint  Chief Complaint  Patient presents with  . Cough    started on Sunday with a deep dry cough. otc: cough pm meds - patient stated that she has been smelling smoke for 4 days but has not been around any smoke  . Chest Pain  . Wheezing    HPI  Patient endorses cough x4 days with chest congestion and wheezing state felt she was getting better but last night got significantly worse. Feels hard to take a deep breath. Has taken cough pm syrup. Patient endorses dry cough- states coughed so much had headache. Endorses mild shortness of breath with exertion.  Mild nasal congestion.She was taking care of grand kids that had a similar cough.  Denies sore throat, facial pain, fevers, chills.   Patient Active Problem List   Diagnosis Date Noted  . Positive ANA (antinuclear antibody) 05/26/2018  . Elevated ferritin 04/25/2018  . Arthritis, multiple joint involvement 02/22/2018  . Preventative health care 01/28/2016  . Urine cytology abnormal 07/01/2015  . Elevated ferritin level 02/15/2015  . Adult hypothyroidism 02/11/2015  . Encounter for preventive health examination 02/06/2015  . Left shoulder pain 02/06/2015  . Hematuria, microscopic 02/06/2015  . Left-sided ischial pain 02/06/2015  . Blood glucose elevated 02/06/2015  . Microscopic hematuria 02/06/2015  . Arthralgia of hip 02/06/2015  . Pain in shoulder 02/06/2015  . Fatigue   . Awareness of heartbeats 08/06/2014  . Hyperthyroidism 01/15/2014  . History of tobacco use 08/28/2013    Past Medical History:  Diagnosis Date  . Arthritis    in hands  . Fall from horse 11/2012   left leg injury/sligth concussion  . Fatigue   . Gross hematuria 2015  . History of panic attacks    in the past/no meds  . Hypothyroidism   . MVP (mitral valve prolapse)   . Vitamin B12 deficiency     Past Surgical History:   Procedure Laterality Date  . ABDOMINAL HYSTERECTOMY    . APPENDECTOMY    . BREAST BIOPSY     left breast/benign  . BREAST LUMPECTOMY     left breast  . COLONOSCOPY  05/2018   patient had 3 polyps - Dr. Lyndel Safe in [place] Use  . Smoking status: Former Smoker    Types: Cigarettes    Last attempt to quit: 1980    Years since quitting: 40.1  . Smokeless tobacco: Never Used  . Tobacco comment: 5 years of smoking at age of 65  Substance Use Topics  . Alcohol use: Yes    Alcohol/week: 7.0 standard drinks    Types: 7 Glasses of wine per week    Comment: wine only     Current Outpatient Medications:  .  BIOTIN PO, Take by mouth daily. Reported on 07/29/2015, Disp: , Rfl:  .  Cholecalciferol (VITAMIN D PO), Take by mouth daily., Disp: , Rfl:  .  Multiple Vitamin (MULTIVITAMIN) capsule, Take by mouth daily., Disp: , Rfl:  .  vitamin B-12 (CYANOCOBALAMIN) 500 MCG tablet, Take 500 mcg by mouth daily., Disp: , Rfl:  .  vitamin E 1000 UNIT capsule, Take 1,000 Units by mouth daily., Disp: , Rfl:   Allergies  Allergen Reactions  . Tetracycline Other (See Comments)    Heart flutters  . Cephalexin Other (See Comments)  Pt unsure of reaction    ROS   No other specific complaints in a complete review of systems (except as listed in HPI above).  Objective  Vitals:   07/20/18 1000  BP: 118/76  Pulse: 88  Resp: 16  Temp: 97.9 F (36.6 C)  TempSrc: Oral  SpO2: 98%  Weight: 162 lb 3.2 oz (73.6 kg)    Body mass index is 26.18 kg/m.  Nursing Note and Vital Signs reviewed.  Physical Exam HENT:     Head: Normocephalic and atraumatic.     Right Ear: Hearing, tympanic membrane, ear canal and external ear normal.     Left Ear: Hearing, tympanic membrane, ear canal and external ear normal.     Nose: Congestion present.     Right Sinus: No maxillary sinus tenderness or frontal sinus tenderness.     Left Sinus: No maxillary sinus tenderness or  frontal sinus tenderness.     Mouth/Throat:     Mouth: Mucous membranes are moist.     Pharynx: Uvula midline. No oropharyngeal exudate or posterior oropharyngeal erythema.  Eyes:     General:        Right eye: No discharge.        Left eye: No discharge.     Conjunctiva/sclera: Conjunctivae normal.  Neck:     Musculoskeletal: Normal range of motion.  Cardiovascular:     Rate and Rhythm: Normal rate.     Pulses: Normal pulses.  Pulmonary:     Effort: Pulmonary effort is normal.     Breath sounds: Normal breath sounds.  Lymphadenopathy:     Cervical: No cervical adenopathy.  Skin:    General: Skin is warm and dry.     Findings: No rash.  Neurological:     General: No focal deficit present.     Mental Status: She is alert and oriented to person, place, and time.  Psychiatric:        Judgment: Judgment normal.       No results found for this or any previous visit (from the past 48 hour(s)).  Assessment & Plan  1. Viral upper respiratory tract infection Discussed OTC treatment.  - benzonatate (TESSALON) 200 MG capsule; Take 1 capsule (200 mg total) by mouth 2 (two) times daily as needed for cough.  Dispense: 30 capsule; Refill: 1 - guaiFENesin (MUCINEX) 600 MG 12 hr tablet; Take 1 tablet (600 mg total) by mouth 2 (two) times daily.  Dispense: 30 tablet; Refill: 0  2. Cough - benzonatate (TESSALON) 200 MG capsule; Take 1 capsule (200 mg total) by mouth 2 (two) times daily as needed for cough.  Dispense: 30 capsule; Refill: 1 - guaiFENesin (MUCINEX) 600 MG 12 hr tablet; Take 1 tablet (600 mg total) by mouth 2 (two) times daily.  Dispense: 30 tablet; Refill: 0 Get chest xray if shortness of breath worsens, developing fevers/chills - DG Chest 2 View; Future  3. Shortness of breath - DG Chest 2 View; Future     -Red flags and when to present for emergency care or RTC including fever >101.24F, chest pain, shortness of breath, new/worsening/un-resolving symptoms reviewed with  patient at time of visit. Follow up and care instructions discussed and provided in AVS.

## 2018-07-29 ENCOUNTER — Telehealth: Payer: Self-pay

## 2018-07-29 NOTE — Telephone Encounter (Signed)
I called this patient to review Elizabeth's message but there was no answer and the mailbox was full so I was not able to leave a message.

## 2018-07-29 NOTE — Telephone Encounter (Signed)
Copied from [place] [id number]. Topic: General - Other >> Jul 29, 2018  9:04 AM Antonieta Iba C wrote: Reason for CRM: pt was seen and says that she was Dx with Bronchitis. Pt says that had some amoxicillin at her home that she took on her own because she felt that it would help. Pt would like to know if she should take if for a complete 10 days? If so, pt would like to know if provider could send in a Rx for amoxicillin, pt says that she only had enough at home for 7 days.   Pharmacy: [id number] [address], [place] [phone] (Phone) [phone] (Fax)

## 2018-07-29 NOTE — Telephone Encounter (Signed)
Pt called back. Message from Suezanne Cheshire NP given to pt. Pt verbalized understanding.

## 2018-07-29 NOTE — Telephone Encounter (Signed)
It is not recommended to treat bronchitis with antibiotics unless there is a high risk for pneumonia. If having fevers, chills, shortness of breath please get chest xray ordered already. For sinus infections that do not respond to over the counter treatment and appear to be bacterial we treat with amoxicillin as prescribed by provider for 5-7 days if showing improvement, if not we typically switch class of antibiotic and extend duration of therapy. We do not recommend taking antibiotics that have not been prescribed for specific condition as it may not properly treat infection, have negative side effect and lead to antibiotic resistance.  She is also welcome to come in for re-evaluation. Please let us know how else we can help.

## 2019-05-01 ENCOUNTER — Encounter: Payer: BLUE CROSS/BLUE SHIELD | Admitting: Family Medicine

## 2019-08-04 ENCOUNTER — Other Ambulatory Visit: Payer: Self-pay | Admitting: Gynecology

## 2019-08-04 DIAGNOSIS — Z1231 Encounter for screening mammogram for malignant neoplasm of breast: Secondary | ICD-10-CM

## 2019-10-13 ENCOUNTER — Other Ambulatory Visit: Payer: Self-pay

## 2019-10-13 ENCOUNTER — Ambulatory Visit: Payer: BLUE CROSS/BLUE SHIELD

## 2019-10-13 ENCOUNTER — Ambulatory Visit
Admission: RE | Admit: 2019-10-13 | Discharge: 2019-10-13 | Disposition: A | Discharge status: Home / Self Care | Payer: 59 | Source: Ambulatory Visit | Attending: Gynecology | Admitting: Gynecology

## 2019-10-13 DIAGNOSIS — Z1231 Encounter for screening mammogram for malignant neoplasm of breast: Secondary | ICD-10-CM

## 2019-10-13 IMAGING — MG DIGITAL SCREENING BILAT W/ TOMO W/ CAD
8 series · 8 of 24 positions shown · non-contrast
Comparison: Previous exam(s).

CLINICAL DATA: Screening.

EXAM:
DIGITAL SCREENING BILATERAL MAMMOGRAM WITH TOMO AND CAD

[L MLO synth-2D]
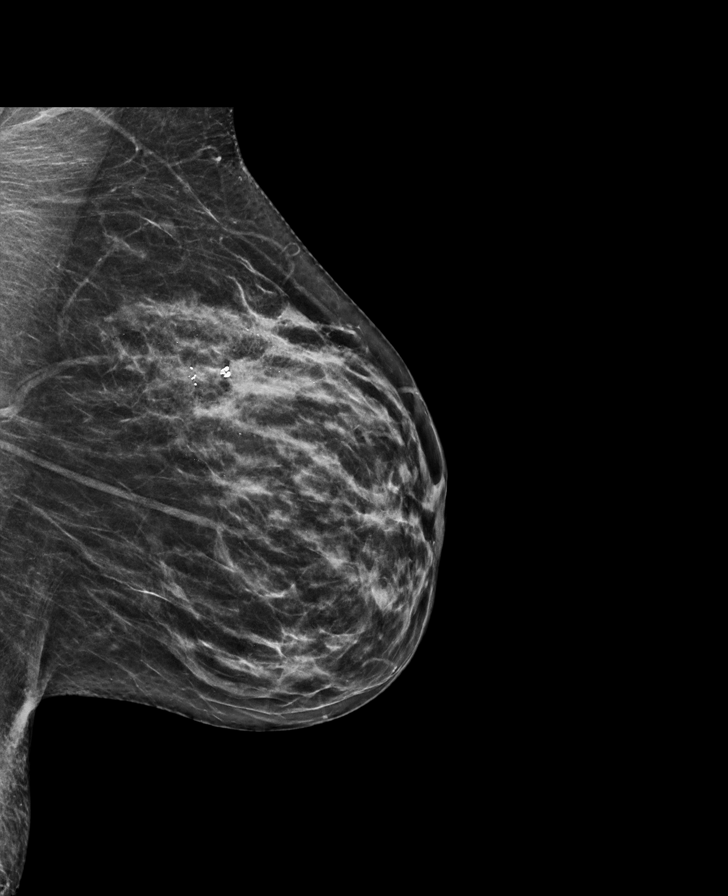

[R CC synth-2D]
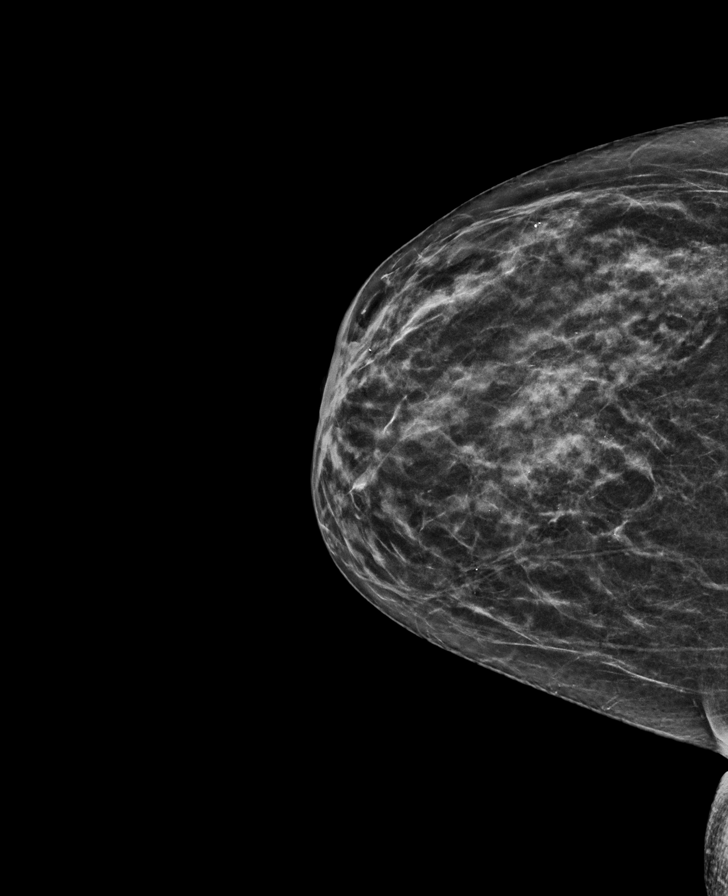

[L CC synth-2D]
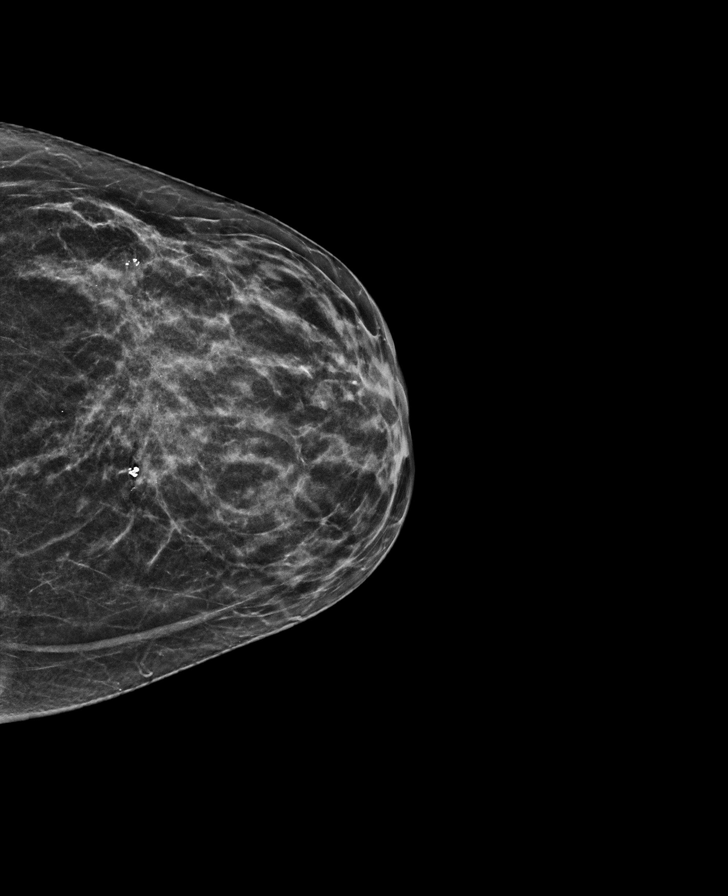

[R MLO synth-2D]
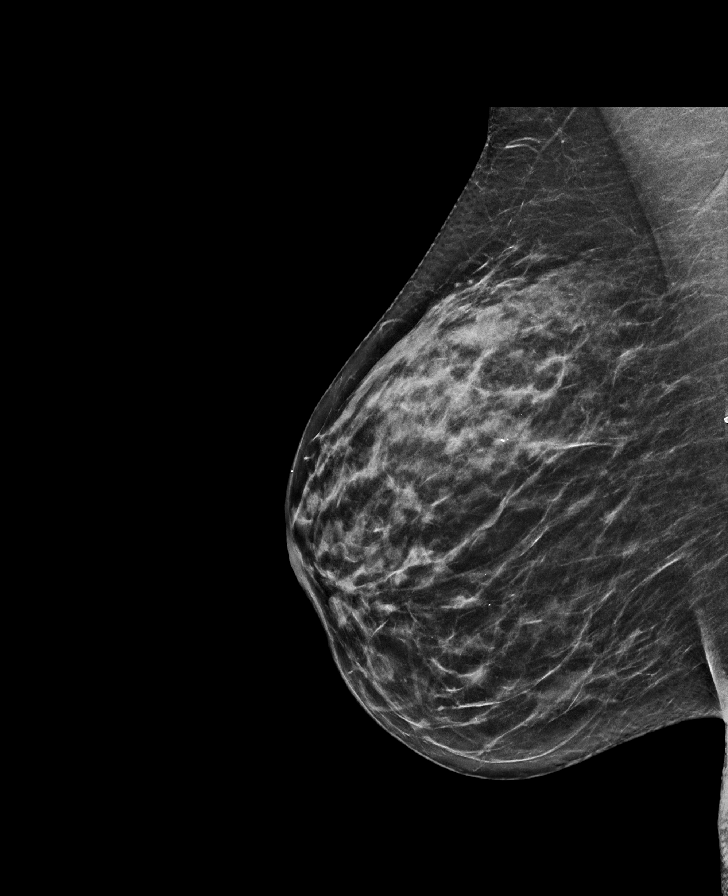

[R CC tomo · tomo slice 27/53.0]
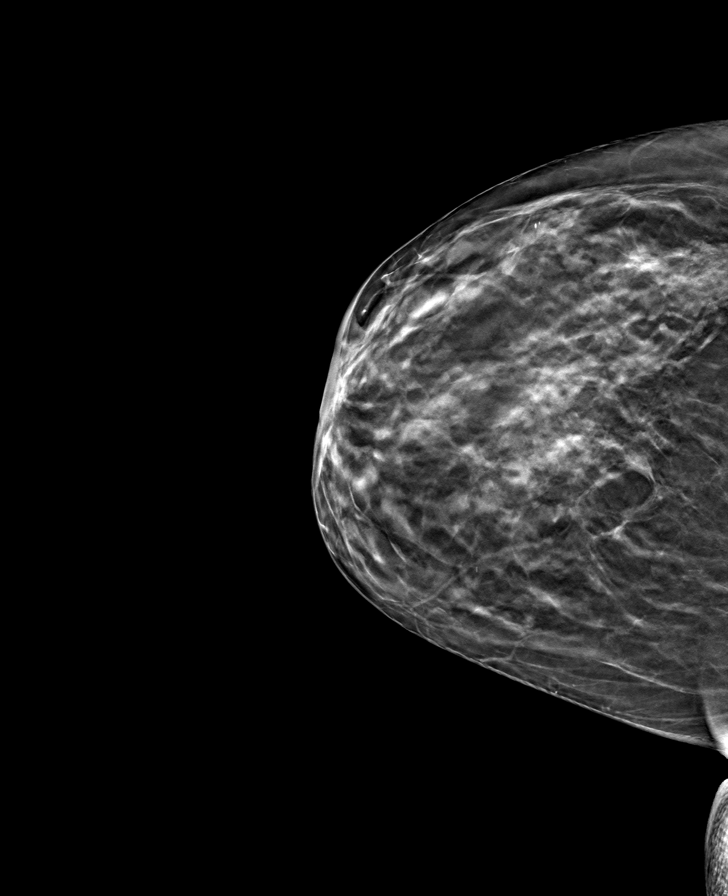

[L MLO tomo · tomo slice 30/59.0]
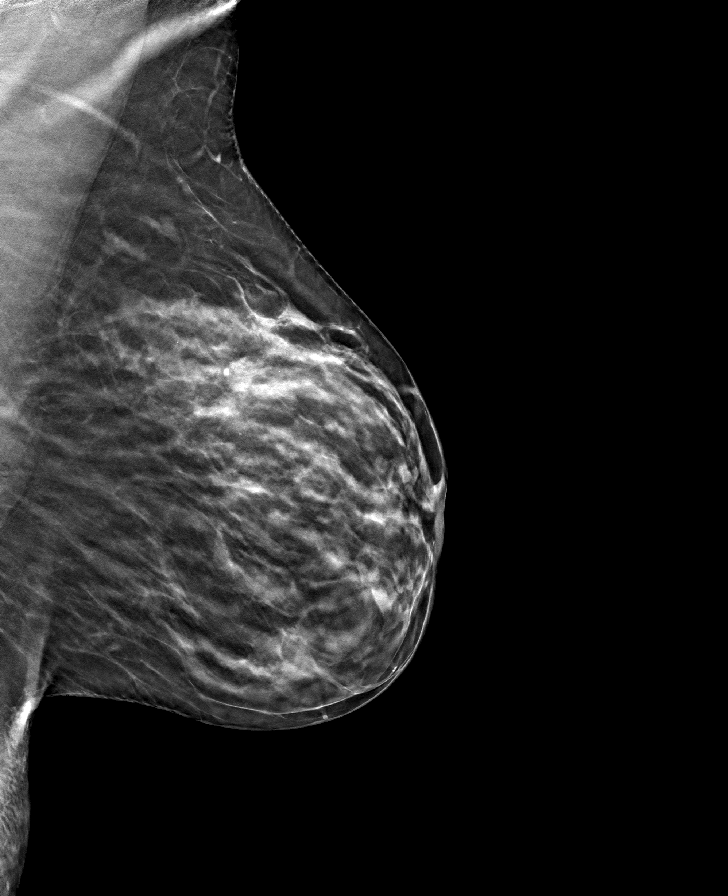

[L CC tomo · tomo slice 27/53.0]
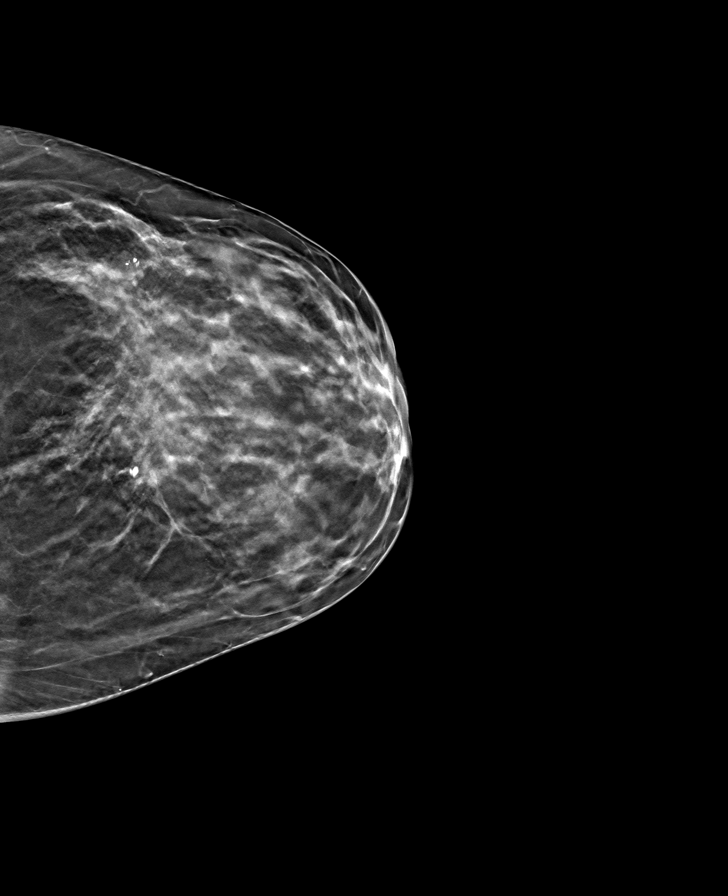

[R MLO tomo · tomo slice 29/57.0]
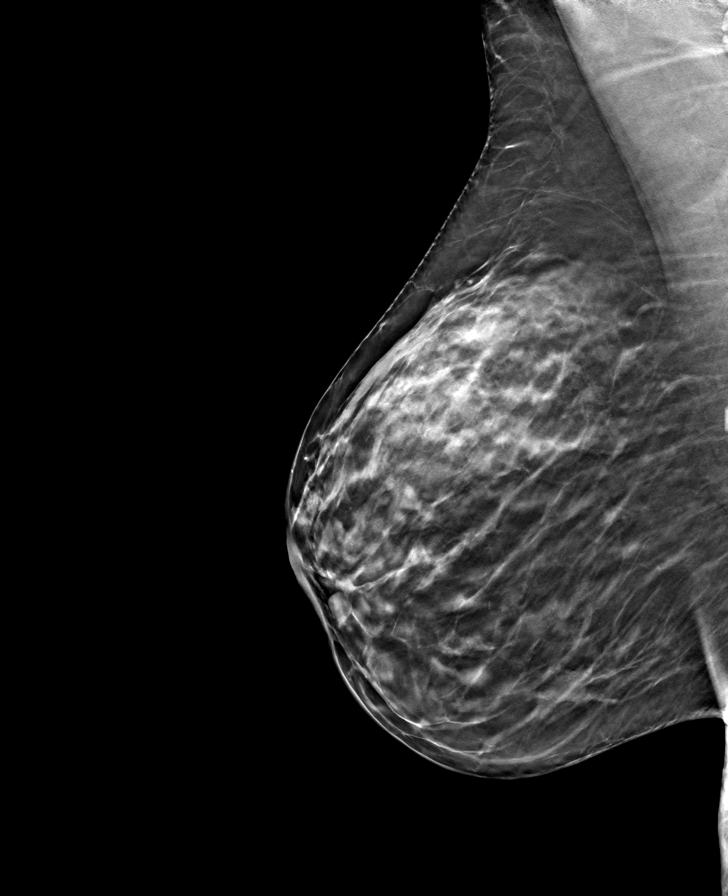

[8 of 24 positions shown; findings below may reference images not displayed]

ACR Breast Density Category c: The breast tissue is heterogeneously
dense, which may obscure small masses.
FINDINGS: There are no findings suspicious for malignancy. Images were
processed with CAD.
IMPRESSION: No mammographic evidence of malignancy. A result letter of this
screening mammogram will be mailed directly to the patient.

RECOMMENDATION:
Screening mammogram in one year. (Code:[5V])

BI-RADS CATEGORY  1: Negative.

## 2021-03-29 ENCOUNTER — Emergency Department: Payer: 59

## 2021-03-29 ENCOUNTER — Emergency Department
Admission: EM | Admit: 2021-03-29 | Discharge: 2021-03-29 | Disposition: A | Discharge status: Home / Self Care | Payer: 59 | Attending: Emergency Medicine | Admitting: Emergency Medicine

## 2021-03-29 DIAGNOSIS — Z9012 Acquired absence of left breast and nipple: Secondary | ICD-10-CM | POA: Diagnosis not present

## 2021-03-29 DIAGNOSIS — M79601 Pain in right arm: Secondary | ICD-10-CM | POA: Insufficient documentation

## 2021-03-29 DIAGNOSIS — W19XXXA Unspecified fall, initial encounter: Secondary | ICD-10-CM | POA: Insufficient documentation

## 2021-03-29 DIAGNOSIS — Z87891 Personal history of nicotine dependence: Secondary | ICD-10-CM | POA: Insufficient documentation

## 2021-03-29 DIAGNOSIS — S42201A Unspecified fracture of upper end of right humerus, initial encounter for closed fracture: Secondary | ICD-10-CM | POA: Insufficient documentation

## 2021-03-29 DIAGNOSIS — E039 Hypothyroidism, unspecified: Secondary | ICD-10-CM | POA: Insufficient documentation

## 2021-03-29 DIAGNOSIS — S4991XA Unspecified injury of right shoulder and upper arm, initial encounter: Secondary | ICD-10-CM | POA: Diagnosis present

## 2021-03-29 IMAGING — DX DG SHOULDER 2+V*R*
2 series · 2 of 2 positions shown · non-contrast
Comparison: None.

CLINICAL DATA: Fall, right shoulder pain

EXAM:
RIGHT SHOULDER - 2+ VIEW

[shoulder ap]
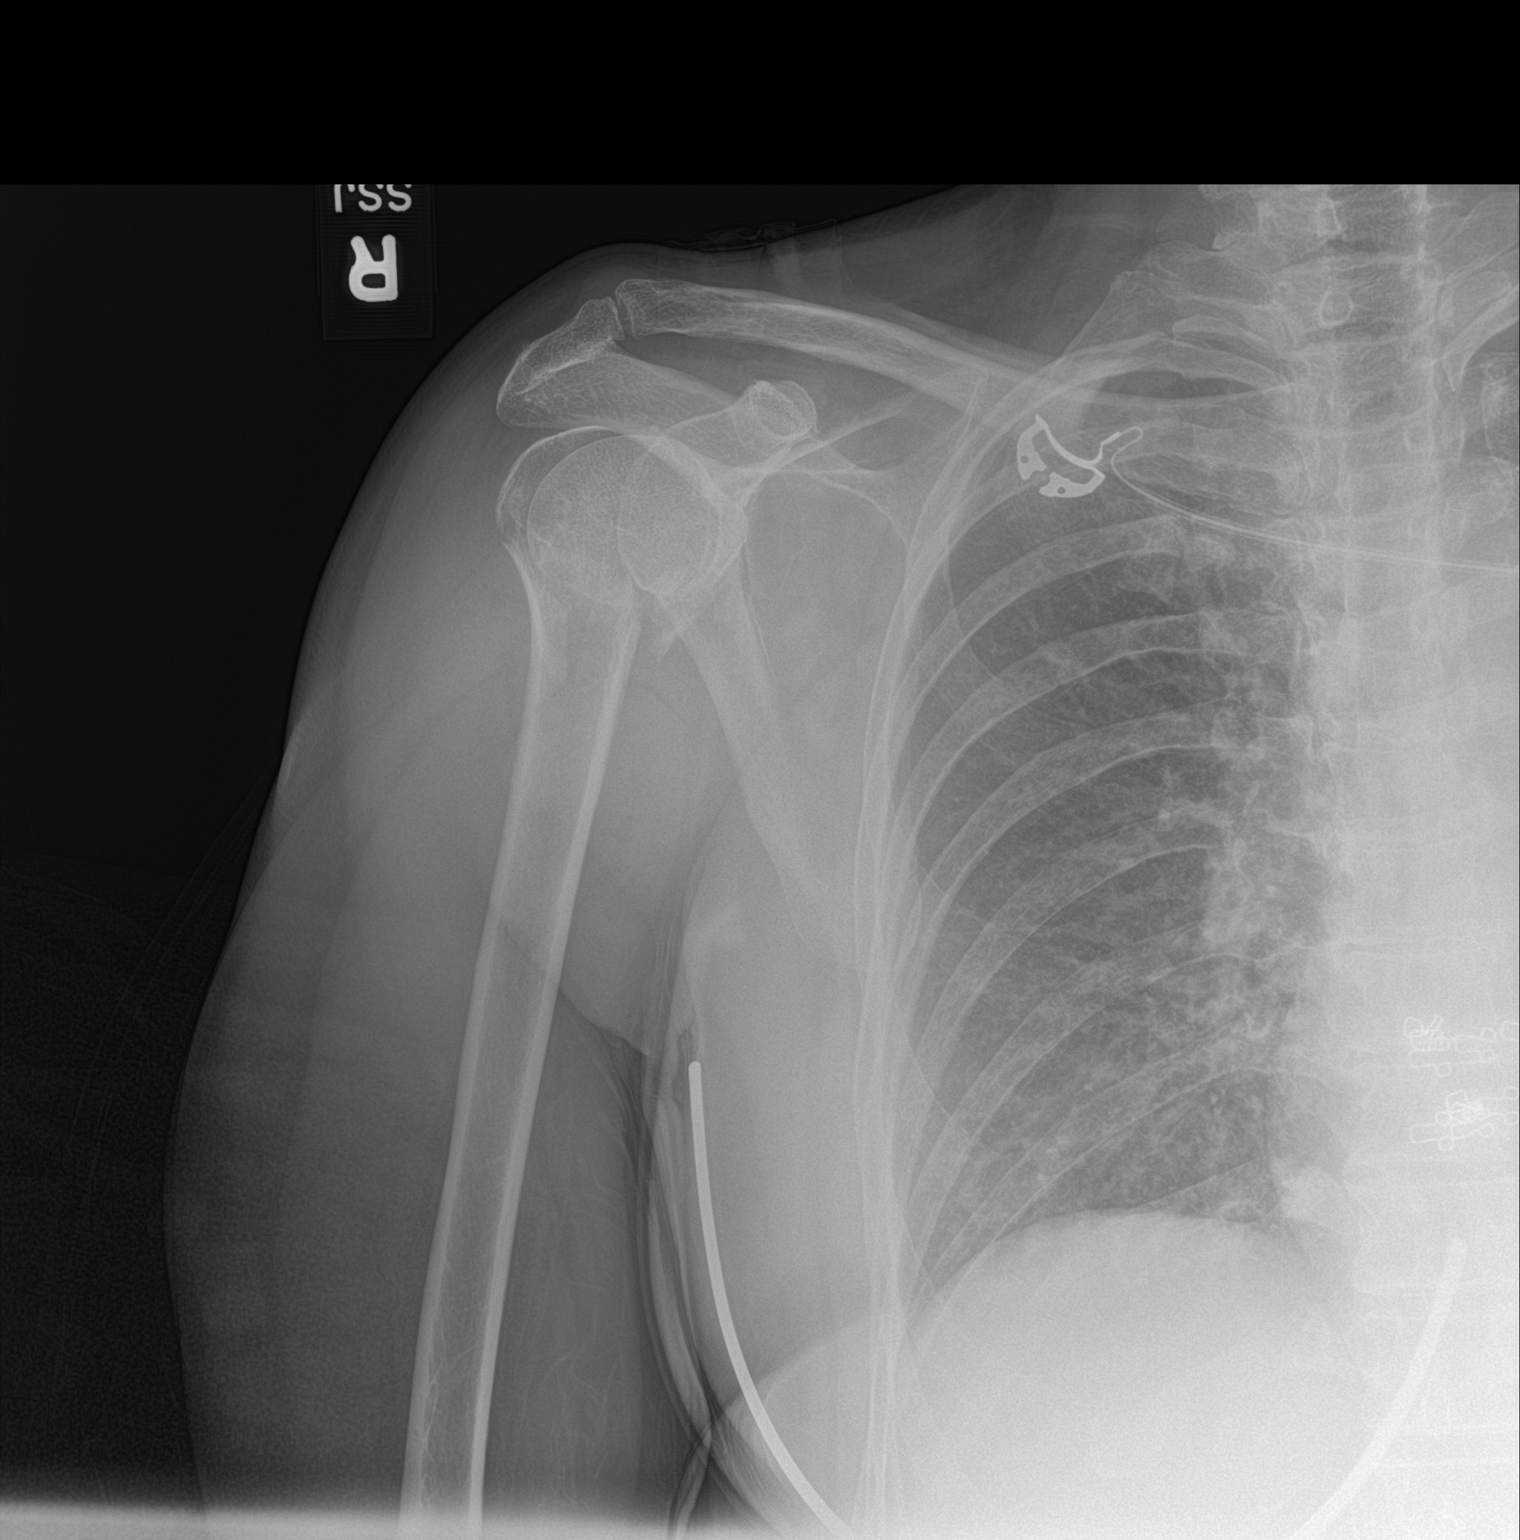

[shoulder obl]
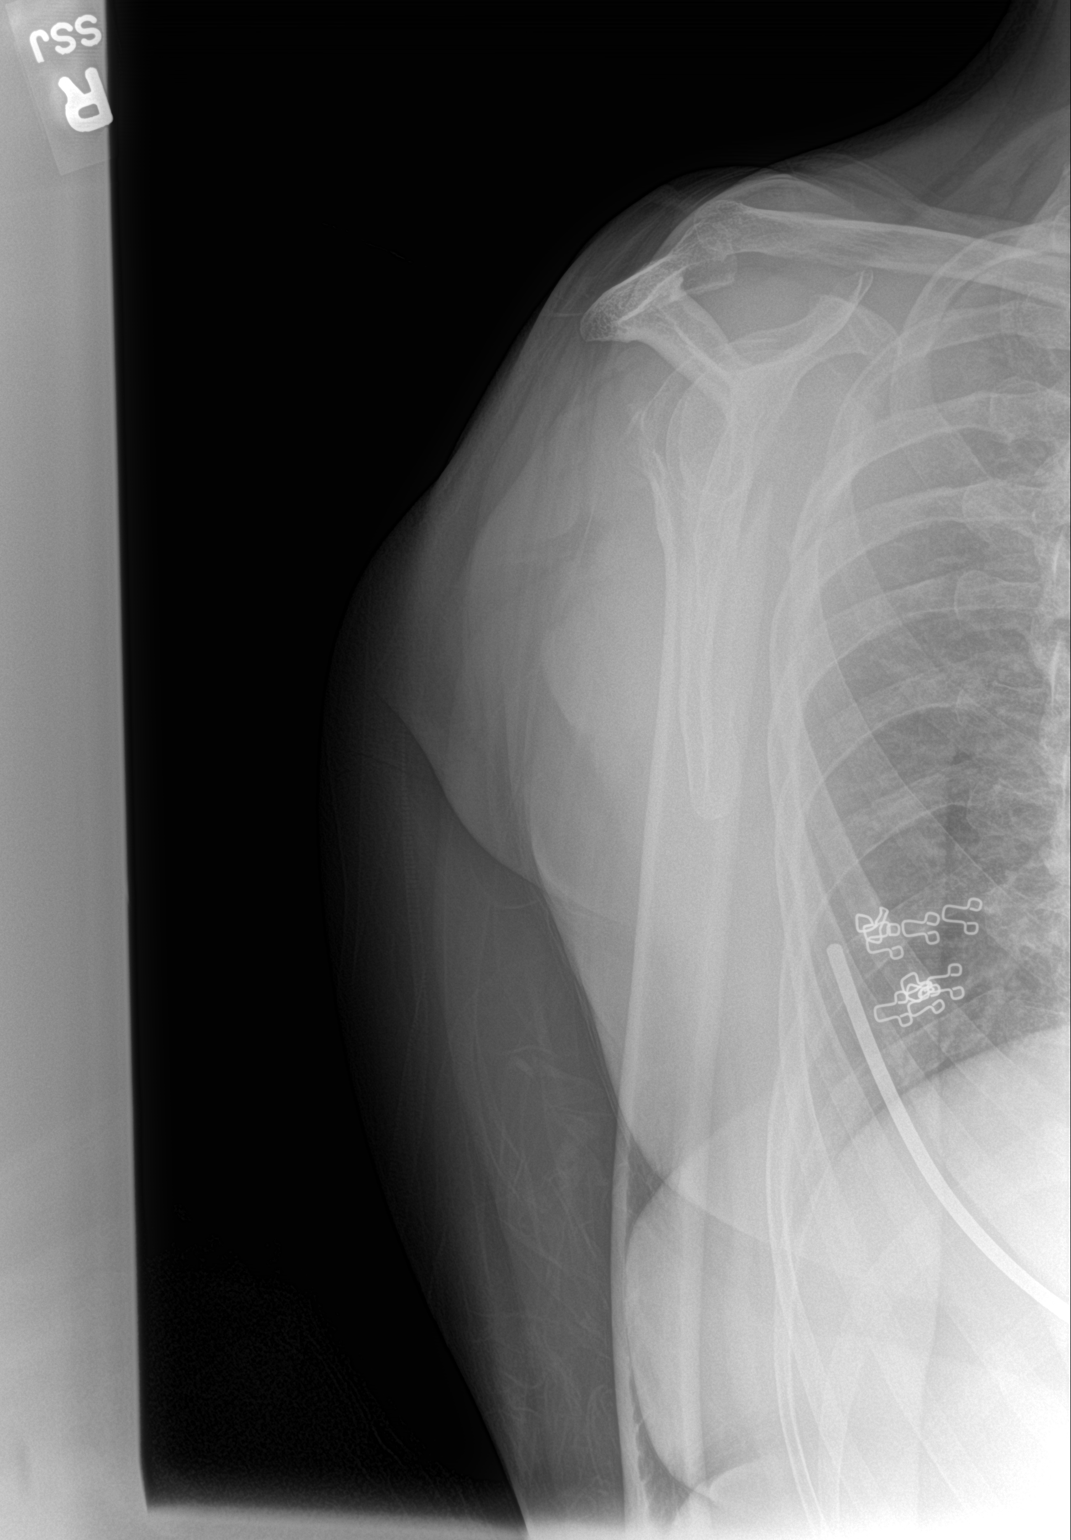

[2 of 2 positions shown; findings below may reference images not displayed]

FINDINGS: There is an acute, displaced fracture of the right humeral head
involving at least the anatomic neck of the humerus. A fracture
plane may also exist across the surgical neck of the humerus,
however, this is not optimally visualized on this examination.
Anterior fracture fragment of the humeral head appears displaced
medially by 3 cortical widths. Note evaluation of the right
hemithorax is unremarkable.
IMPRESSION: Acute right humeral head fracture as described above. CT imaging is
recommended for further characterization.

## 2021-03-29 IMAGING — DX DG HUMERUS 2V *R*
2 series · 2 of 2 positions shown · non-contrast
Comparison: None.

CLINICAL DATA: Fall, right arm pain

EXAM:
RIGHT HUMERUS - 2+ VIEW

[humerus lat (1 of 2)]
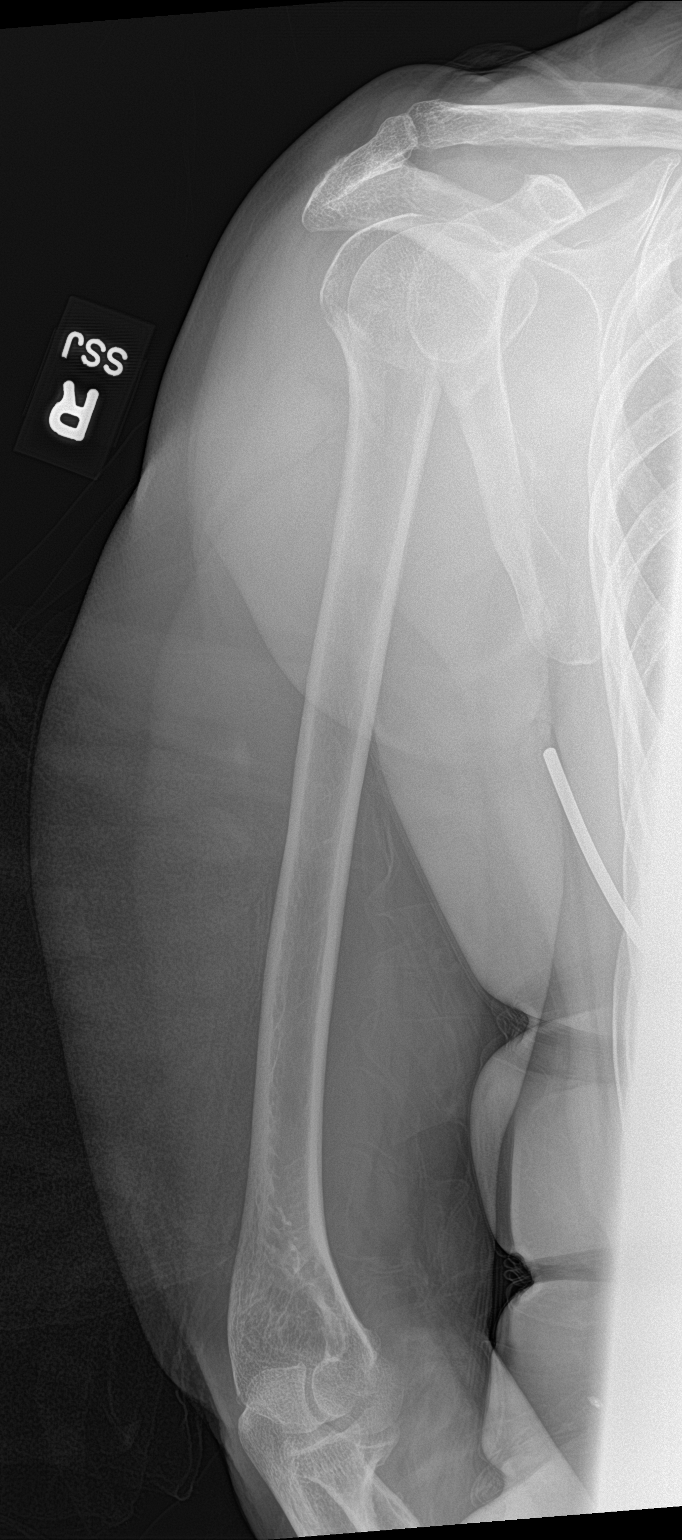

[humerus lat (2 of 2)]
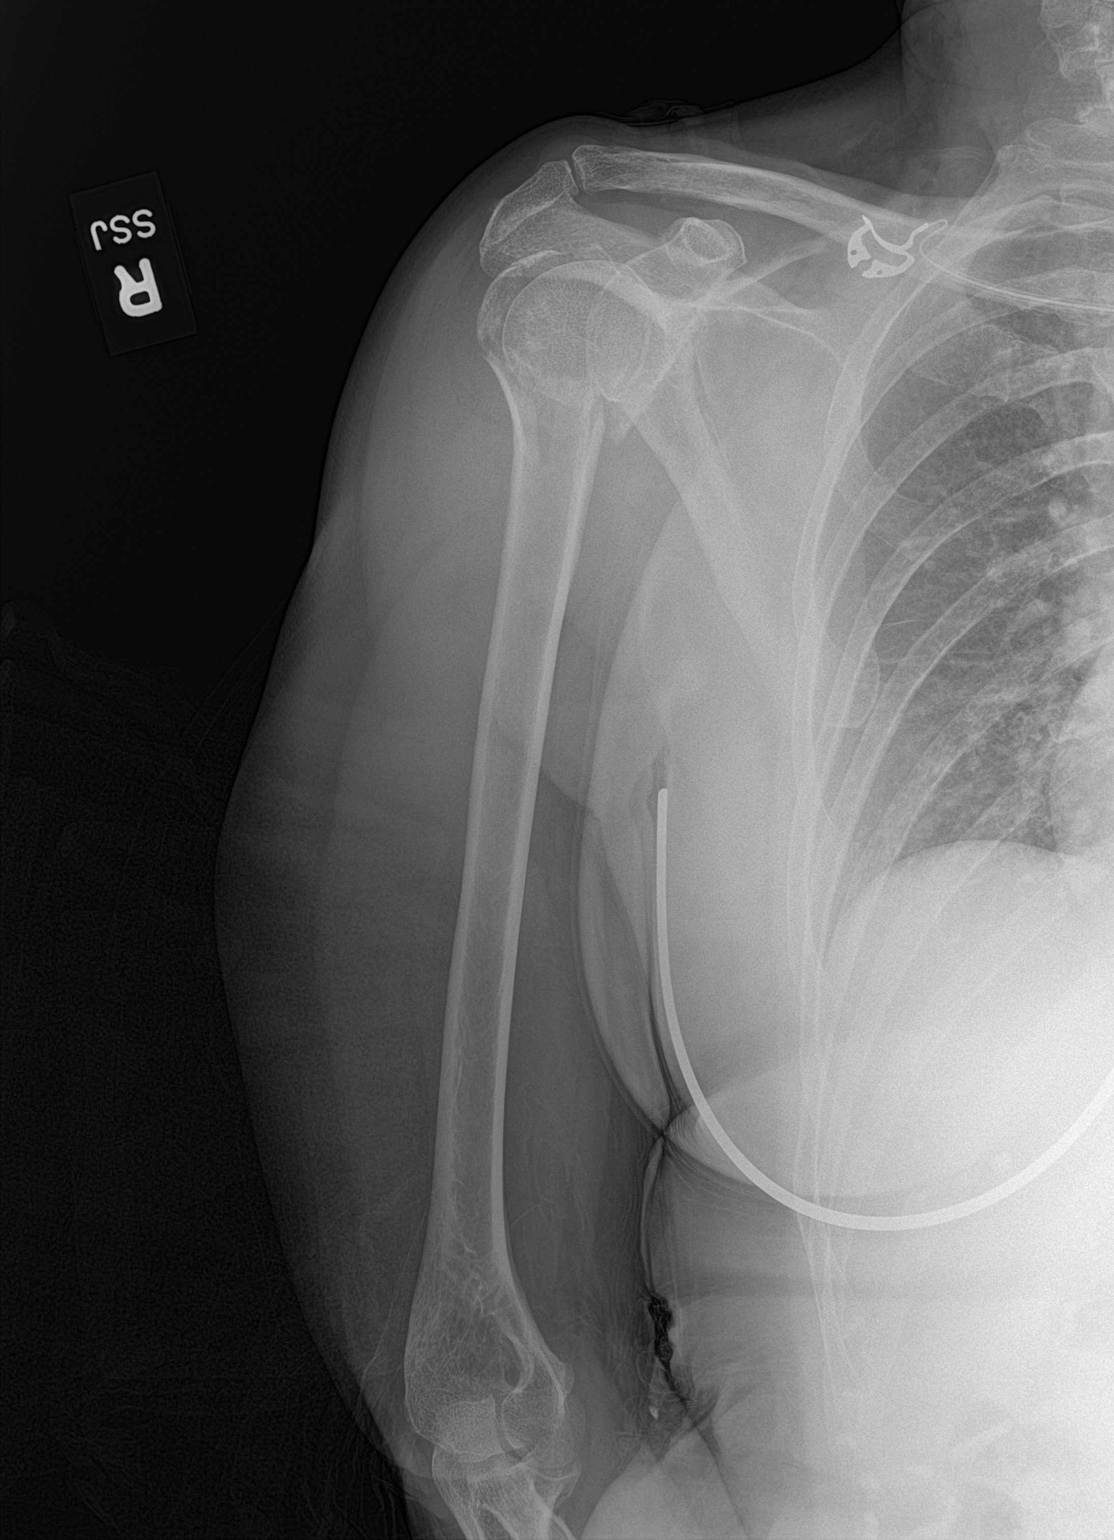

[2 of 2 positions shown; findings below may reference images not displayed]

FINDINGS: There is an acute, mildly displaced fracture of the anatomic neck of
the right humeral head. No dislocation. The remainder of the right
humerus is intact.
IMPRESSION: Acute fracture of the right humeral head involving the anatomic
neck.

## 2021-03-29 MED ORDER — ONDANSETRON 4 MG PO TBDP
4.0000 mg | ORAL_TABLET | Freq: Once | ORAL | Status: AC
  Administered 2021-03-29: 4 mg via ORAL
  Filled 2021-03-29: qty 1

## 2021-03-29 MED ORDER — OXYCODONE HCL 5 MG PO TABS: 5.0000 mg | ORAL_TABLET | Freq: Three times a day (TID) | ORAL | 0 refills | Status: AC | PRN

## 2021-03-29 MED ORDER — MORPHINE SULFATE (PF) 4 MG/ML IV SOLN
4.0000 mg | Freq: Once | INTRAVENOUS | Status: AC
  Administered 2021-03-29: 4 mg via INTRAMUSCULAR
  Filled 2021-03-29: qty 1

## 2021-03-29 MED ORDER — OXYCODONE HCL 5 MG PO TABS
10.0000 mg | ORAL_TABLET | Freq: Once | ORAL | Status: AC
  Administered 2021-03-29: 10 mg via ORAL
  Filled 2021-03-29: qty 2

## 2021-03-29 NOTE — ED Notes (Signed)
Spouse is at bedside.

## 2021-03-29 NOTE — ED Provider Notes (Signed)
[facility]  ____________________________________________   Event Date/Time   First MD Initiated Contact with Patient 03/29/21 1937     (approximate)  I have reviewed the triage vital signs and the nursing notes.   HISTORY  Chief Complaint Muscle Pain    HPI Vickie Drake is a 60 y.o. female with no significant past medical history presents with an arm injury.  Patient was the rains of a horse when the horse took off and her arm was stuck so she was pulled along with it, against the ground.  She endorses significant right arm and shoulder pain.  Denies numbness or weakness.  She denies hitting her head.  She has some pain in the left fifth digit but otherwise denies acute injury.         Past Medical History:  Diagnosis Date   Arthritis    in hands   Fall from horse 11/2012   left leg injury/sligth concussion   Fatigue    Gross hematuria 2015   History of panic attacks    in the past/no meds   Hypothyroidism    MVP (mitral valve prolapse)    Vitamin B12 deficiency     Patient Active Problem List   Diagnosis Date Noted   Positive ANA (antinuclear antibody) 05/26/2018   Elevated ferritin 04/25/2018   Arthritis, multiple joint involvement 02/22/2018   Preventative health care 01/28/2016   Urine cytology abnormal 07/01/2015   Elevated ferritin level 02/15/2015   Adult hypothyroidism 02/11/2015   Encounter for preventive health examination 02/06/2015   Left shoulder pain 02/06/2015   Hematuria, microscopic 02/06/2015   Left-sided ischial pain 02/06/2015   Blood glucose elevated 02/06/2015   Microscopic hematuria 02/06/2015   Arthralgia of hip 02/06/2015   Pain in shoulder 02/06/2015   Fatigue    Awareness of heartbeats 08/06/2014   Hyperthyroidism 01/15/2014   History of tobacco use 08/28/2013    Past Surgical History:  Procedure Laterality Date   ABDOMINAL HYSTERECTOMY     APPENDECTOMY     BREAST BIOPSY     left  breast/benign   BREAST LUMPECTOMY     left breast   COLONOSCOPY  05/2018   patient had 3 polyps - Dr. Lyndel Safe in [place]    Prior to Admission medications   Medication Sig Start Date End Date Taking? Authorizing Provider  oxyCODONE (ROXICODONE) 5 MG immediate release tablet Take 1 tablet (5 mg total) by mouth every 8 (eight) hours as needed for up to 3 days. 03/29/21 04/01/21 Yes Rada Hay, MD  benzonatate (TESSALON) 200 MG capsule Take 1 capsule (200 mg total) by mouth 2 (two) times daily as needed for cough. 07/20/18   Poulose, Bethel Born, NP  BIOTIN PO Take by mouth daily. Reported on 07/29/2015    [provider]  Cholecalciferol (VITAMIN D PO) Take by mouth daily.    [provider]  guaiFENesin (MUCINEX) 600 MG 12 hr tablet Take 1 tablet (600 mg total) by mouth 2 (two) times daily. 07/20/18   Poulose, Bethel Born, NP  Multiple Vitamin (MULTIVITAMIN) capsule Take by mouth daily.    [provider]  vitamin B-12 (CYANOCOBALAMIN) 500 MCG tablet Take 500 mcg by mouth daily.    [provider]  vitamin E 1000 UNIT capsule Take 1,000 Units by mouth daily.    [provider]    Allergies Tetracycline and Cephalexin  Family History  Problem Relation Age of Onset   Diabetes Mother  Cancer Mother        breast   Breast cancer Mother    Heart disease Father    Heart attack Father    Hypertension Father    Diabetes Sister    Diabetes Brother    Colon cancer Neg Hx    Esophageal cancer Neg Hx    Stomach cancer Neg Hx    Rectal cancer Neg Hx     Social History Social History   Tobacco Use   Smoking status: Former    Types: Cigarettes    Quit date: 1980    Years since quitting: 42.8   Smokeless tobacco: Never   Tobacco comments:    5 years of smoking at age of 10  Vaping Use   Vaping Use: Never used  Substance Use Topics   Alcohol use: Yes    Alcohol/week: 7.0 standard drinks    Types: 7 Glasses of wine per week     Comment: wine only   Drug use: No    Review of Systems   Review of Systems  Musculoskeletal:  Positive for arthralgias and myalgias. Negative for back pain and neck pain.  Neurological:  Negative for weakness and numbness.  All other systems reviewed and are negative.  Physical Exam Updated Vital Signs BP 124/73   Pulse 69   Temp 98.4 F (36.9 C) (Oral)   Resp 14   Wt 67.1 kg   SpO2 98%   BMI 23.89 kg/m   Physical Exam Vitals and nursing note reviewed.  Constitutional:      General: She is not in acute distress.    Appearance: Normal appearance.  HENT:     Head: Normocephalic and atraumatic.  Eyes:     General: No scleral icterus.    Conjunctiva/sclera: Conjunctivae normal.  Pulmonary:     Effort: Pulmonary effort is normal. No respiratory distress.     Breath sounds: No stridor.  Musculoskeletal:        General: No deformity or signs of injury.     Cervical back: Normal range of motion.     Comments: Swelling of the right proximal arm, pain with range of the shoulder, no obvious deformity, able to range the elbow and wrist Intact okay sign, thumbs up, finger abduction 2+ radial pulse  No chest wall tenderness, no pelvic tenderness, pelvis is stable no swelling deformity or tenderness of the bilateral lower extremities, normal range of motion of the bilateral hips  Skin:    General: Skin is dry.     Coloration: Skin is not jaundiced or pale.  Neurological:     General: No focal deficit present.     Mental Status: She is alert and oriented to person, place, and time. Mental status is at baseline.  Psychiatric:        Mood and Affect: Mood normal.        Behavior: Behavior normal.     LABS (all labs ordered are listed, but only abnormal results are displayed)  Labs Reviewed - No data to display ____________________________________________  EKG  N/a ____________________________________________  RADIOLOGY Almeta Monas, personally viewed and  evaluated these images (plain radiographs) as part of my medical decision making, as well as reviewing the written report by the radiologist.  ED MD interpretation: I reviewed the x-ray of the right shoulder which shows a humeral head fracture    ____________________________________________   PROCEDURES  Procedure(s) performed (including Critical Care):  Procedures   ____________________________________________   INITIAL IMPRESSION /  ASSESSMENT AND PLAN / ED COURSE     Patient is a 60 year old female presents after being dragged by a horse.  She has significant right arm pain. On exam there is some swelling of the right upper humeral area no obvious deformity.  He is neurovascular intact.  No other signs of trauma on exam.  X-ray is notable for a proximal humeral head fracture.  Will place in a sling and have her follow-up with orthopedics.      ____________________________________________   FINAL CLINICAL IMPRESSION(S) / ED DIAGNOSES  Final diagnoses:  Closed fracture of proximal end of right humerus, unspecified fracture morphology, initial encounter     ED Discharge Orders          Ordered    oxyCODONE (ROXICODONE) 5 MG immediate release tablet  Every 8 hours PRN        03/29/21 2043             Note:  This document was prepared using Dragon voice recognition software and may include unintentional dictation errors.    Rada Hay, MD 03/29/21 2228

## 2021-03-29 NOTE — Discharge Instructions (Addendum)
Please wear the sling.  Call to schedule an appointment with orthopedics on Monday.  You should be seen in about 1 week.  You can take Tylenol, Motrin and the oxycodone for breakthrough pain.

## 2021-03-29 NOTE — ED Triage Notes (Signed)
Pt was working with a horse and was drugged by the horse. Pt is complaining of right upper arm pain. Pt did not loose conscience.

## 2021-03-29 NOTE — ED Notes (Signed)
X-ray at bedside

## 2021-03-30 ENCOUNTER — Emergency Department: Payer: 59

## 2021-03-30 DIAGNOSIS — M79604 Pain in right leg: Secondary | ICD-10-CM | POA: Insufficient documentation

## 2021-03-30 DIAGNOSIS — Z5321 Procedure and treatment not carried out due to patient leaving prior to being seen by health care provider: Secondary | ICD-10-CM | POA: Insufficient documentation

## 2021-03-30 NOTE — ED Triage Notes (Signed)
Patient here via POV, patient was BIB EMS last night after being caught in a lunge line and dragged on the ground by a horse. This RN inspected area of concern, slight swelling noted but no bruising evident. Patient is able to ambulate without assistance, states pain is 10/10, states friend told her to come to ED to be evaluated for a DVT. Patient is AxOx4, in NAD.

## 2021-03-31 ENCOUNTER — Other Ambulatory Visit: Payer: Self-pay | Admitting: Family Medicine

## 2021-03-31 ENCOUNTER — Emergency Department
Admission: EM | Admit: 2021-03-31 | Discharge: 2021-03-31 | Disposition: A | Discharge status: Home / Self Care | Payer: 59 | Attending: Emergency Medicine | Admitting: Emergency Medicine

## 2021-03-31 DIAGNOSIS — S42201D Unspecified fracture of upper end of right humerus, subsequent encounter for fracture with routine healing: Secondary | ICD-10-CM

## 2021-04-01 ENCOUNTER — Ambulatory Visit: Admission: RE | Admit: 2021-04-01 | Payer: 59 | Source: Ambulatory Visit

## 2021-04-01 ENCOUNTER — Ambulatory Visit
Admission: RE | Admit: 2021-04-01 | Discharge: 2021-04-01 | Disposition: A | Discharge status: Home / Self Care | Payer: 59 | Source: Ambulatory Visit | Attending: Family Medicine | Admitting: Family Medicine

## 2021-04-01 ENCOUNTER — Other Ambulatory Visit: Payer: Self-pay

## 2021-04-01 DIAGNOSIS — S42201D Unspecified fracture of upper end of right humerus, subsequent encounter for fracture with routine healing: Secondary | ICD-10-CM | POA: Insufficient documentation

## 2021-04-01 IMAGING — MR MR SHOULDER*R* W/O CM
6 series · 40 of 40 positions shown · non-contrast
Comparison: Right shoulder and humerus x-rays dated [DATE].

CLINICAL DATA: Right shoulder injury on [REDACTED].

EXAM:
MRI OF THE RIGHT SHOULDER WITHOUT CONTRAST
TECHNIQUE: Multiplanar, multisequence MR imaging of the shoulder was performed.
No intravenous contrast was administered.

[Series 3: T2 fat-sat · axial · 4.0mm · 0.59mm/px · z∈[-32,+82]mm · 7 of 27 slices shown (1 of 3)]
[im 1/27]
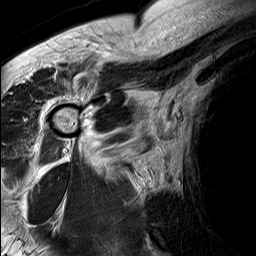
[im 5/27]
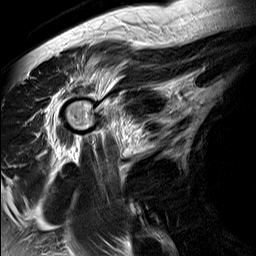
[im 9/27]
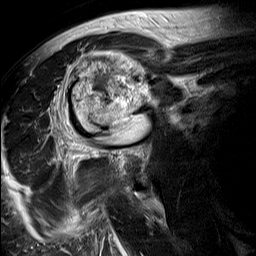
[im 14/27]
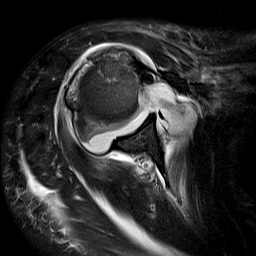
[im 18/27]
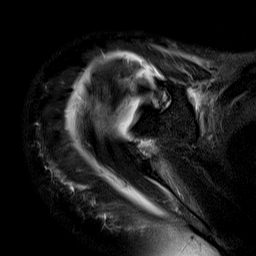
[im 22/27]
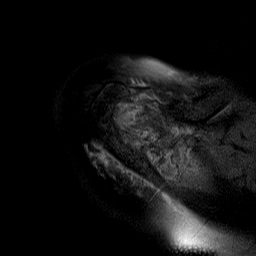
[im 27/27]
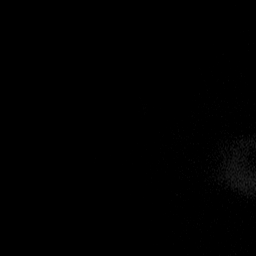

[Series 4: T2 fat-sat · oblique · 4.0mm · 0.59mm/px · 6 of 27 slices shown (2 of 3)]
[im 1/27]
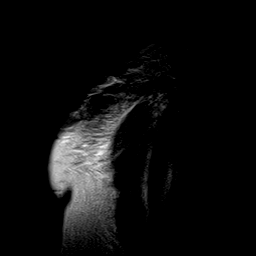
[im 6/27]
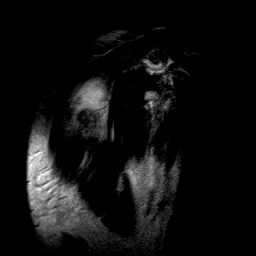
[im 11/27]
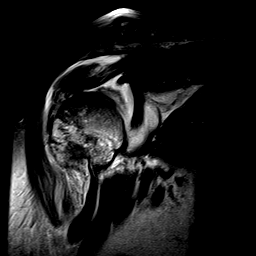
[im 16/27]
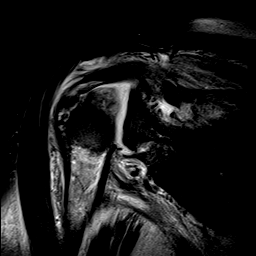
[im 21/27]
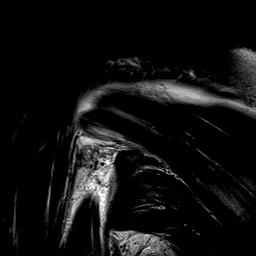
[im 27/27]
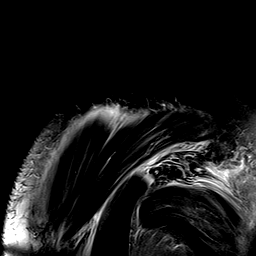

[Series 5: PD · oblique · 4.0mm · 0.59mm/px · 6 of 27 slices shown]
[im 1/27]
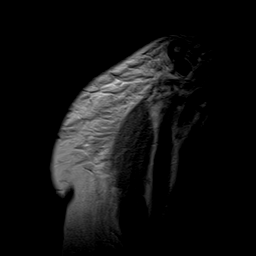
[im 6/27]
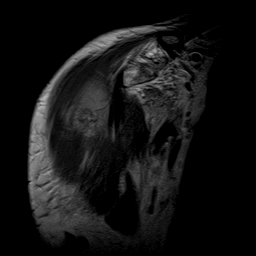
[im 11/27]
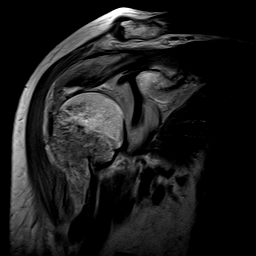
[im 16/27]
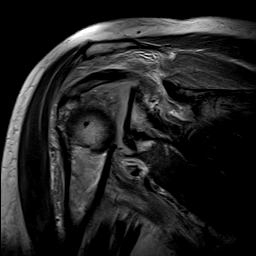
[im 21/27]
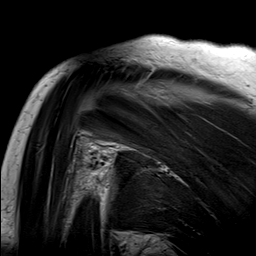
[im 27/27]
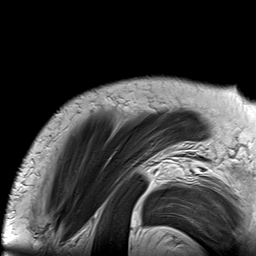

[Series 6: T1 · oblique · 4.0mm · 0.59mm/px · 7 of 31 slices shown]
[im 1/31]
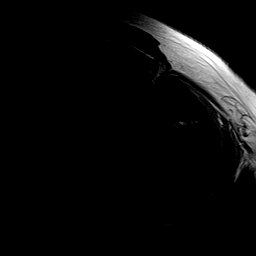
[im 6/31]
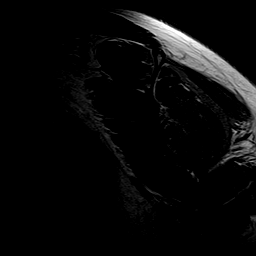
[im 11/31]
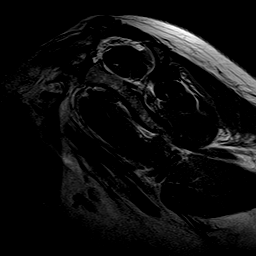
[im 16/31]
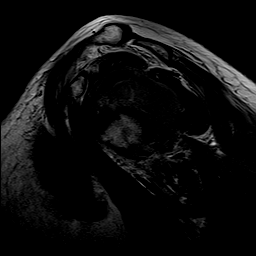
[im 21/31]
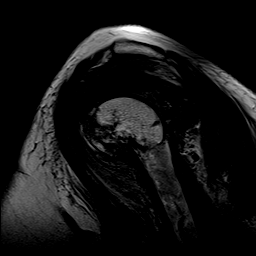
[im 26/31]
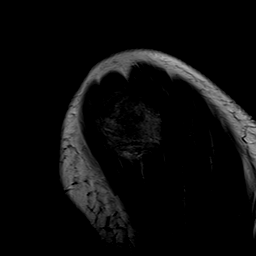
[im 31/31]
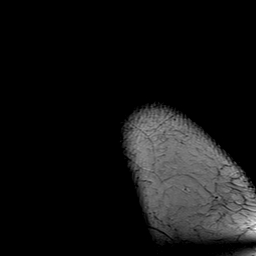

[Series 7: T2 fat-sat · oblique · 4.0mm · 0.59mm/px · 7 of 31 slices shown (3 of 3)]
[im 1/31]
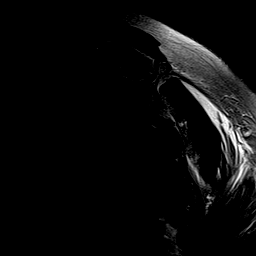
[im 6/31]
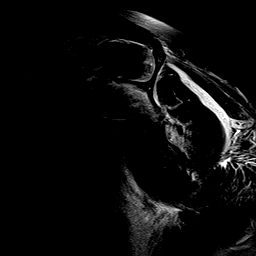
[im 11/31]
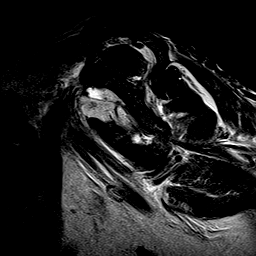
[im 16/31]
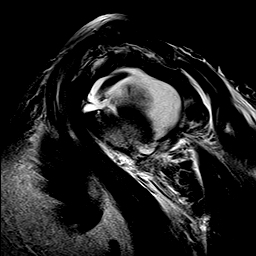
[im 21/31]
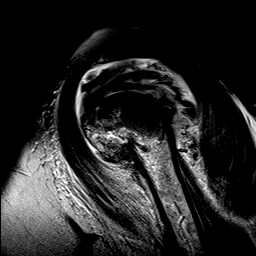
[im 26/31]
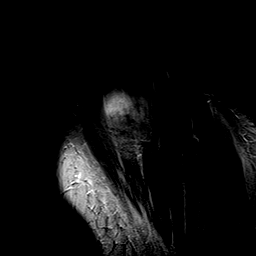
[im 31/31]
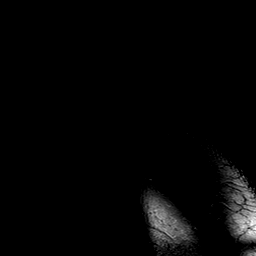

[Series 8: sagittal oblstir · oblique · 4.0mm · 0.59mm/px · 7 of 31 slices shown]
[im 1/31]
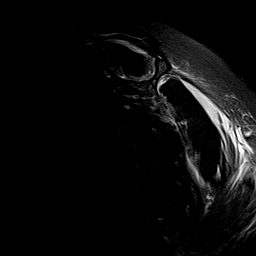
[im 6/31]
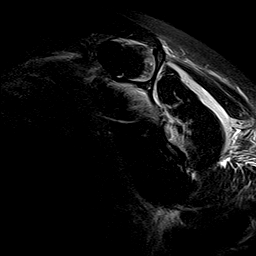
[im 11/31]
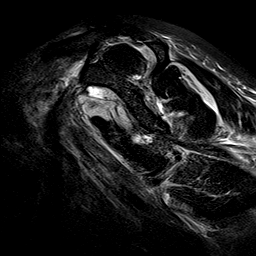
[im 16/31]
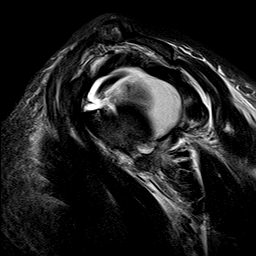
[im 21/31]
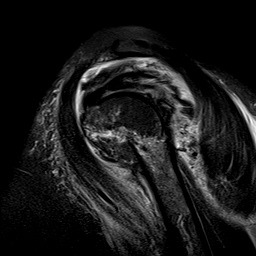
[im 26/31]
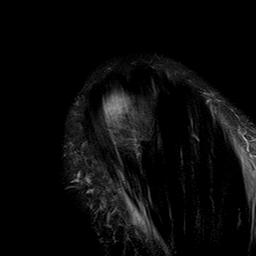
[im 31/31]
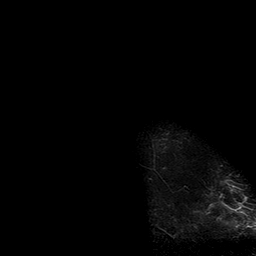

[40 of 40 positions shown; findings below may reference images not displayed]

FINDINGS: Rotator cuff: Small linear intrasubstance tear of the distal
subscapularis tendon. Prominent increased signal within the distal
infraspinatus tendon without discrete tear. The supraspinatus and
teres minor tendons are intact.

Muscles: Mild periscapular muscle edema involving all rotator cuff
muscles. No muscle atrophy.

Biceps long head:  Intact and normally positioned.

Acromioclavicular Joint: Mild arthropathy of the acromioclavicular
joint. Type II acromion. Moderate amount of complex fluid in the
subacromial/subdeltoid bursa.

Glenohumeral Joint: Large joint effusion with fluid-fluid level. No
chondral defect.

Labrum:  Intact.

Bones: Acute, impacted fracture of the proximal humerus surgical
neck with 1.2 cm posterior displacement and 1 cm lateral
displacement. Avulsion fracture of the greater tuberosity at the
supraspinatus, infraspinatus, and teres minor tendon insertions.
Internal rotation and inferior subluxation of the humeral head. No
suspicious bone lesion.

Other: Diffuse soft tissue swelling around the shoulder.
IMPRESSION: 1. Acute, impacted, displaced fracture of the proximal humerus
surgical neck.
2. Avulsion fracture of the greater tuberosity at the supraspinatus,
infraspinatus, and teres minor tendon insertions.
3. Small linear intrasubstance tear of the distal subscapularis
tendon. Prominent increased signal within the distal infraspinatus
tendon could reflect contusion or tendinosis.
4. Large glenohumeral joint hemarthrosis. Moderate
subacromial/subdeltoid hemorrhagic bursitis.

## 2021-04-30 ENCOUNTER — Telehealth: Payer: Self-pay

## 2021-04-30 NOTE — Telephone Encounter (Signed)
I have called and spoke to patient regarding recall colonoscopy. Patient said she has broken her arm and has some other things she's dealing with and will call back and will call back and schedule when she gets things settled

## 2021-06-05 DIAGNOSIS — S42291D Other displaced fracture of upper end of right humerus, subsequent encounter for fracture with routine healing: Secondary | ICD-10-CM | POA: Diagnosis not present

## 2021-06-10 DIAGNOSIS — S42291D Other displaced fracture of upper end of right humerus, subsequent encounter for fracture with routine healing: Secondary | ICD-10-CM | POA: Diagnosis not present

## 2021-06-17 DIAGNOSIS — S42291D Other displaced fracture of upper end of right humerus, subsequent encounter for fracture with routine healing: Secondary | ICD-10-CM | POA: Diagnosis not present

## 2021-06-19 DIAGNOSIS — U071 COVID-19: Secondary | ICD-10-CM | POA: Diagnosis not present

## 2021-06-19 DIAGNOSIS — J208 Acute bronchitis due to other specified organisms: Secondary | ICD-10-CM | POA: Diagnosis not present

## 2021-07-01 DIAGNOSIS — S42291D Other displaced fracture of upper end of right humerus, subsequent encounter for fracture with routine healing: Secondary | ICD-10-CM | POA: Diagnosis not present

## 2021-07-07 DIAGNOSIS — S42291D Other displaced fracture of upper end of right humerus, subsequent encounter for fracture with routine healing: Secondary | ICD-10-CM | POA: Diagnosis not present

## 2021-07-10 DIAGNOSIS — S42291D Other displaced fracture of upper end of right humerus, subsequent encounter for fracture with routine healing: Secondary | ICD-10-CM | POA: Diagnosis not present

## 2021-07-25 DIAGNOSIS — S42291D Other displaced fracture of upper end of right humerus, subsequent encounter for fracture with routine healing: Secondary | ICD-10-CM | POA: Diagnosis not present

## 2021-07-25 DIAGNOSIS — T792XXA Traumatic secondary and recurrent hemorrhage and seroma, initial encounter: Secondary | ICD-10-CM | POA: Diagnosis not present

## 2021-09-19 DIAGNOSIS — T792XXA Traumatic secondary and recurrent hemorrhage and seroma, initial encounter: Secondary | ICD-10-CM | POA: Diagnosis not present

## 2021-09-19 DIAGNOSIS — M7501 Adhesive capsulitis of right shoulder: Secondary | ICD-10-CM | POA: Diagnosis not present

## 2021-09-19 DIAGNOSIS — S7001XD Contusion of right hip, subsequent encounter: Secondary | ICD-10-CM | POA: Diagnosis not present

## 2021-09-19 DIAGNOSIS — S42291D Other displaced fracture of upper end of right humerus, subsequent encounter for fracture with routine healing: Secondary | ICD-10-CM | POA: Diagnosis not present

## 2021-09-22 ENCOUNTER — Other Ambulatory Visit: Payer: Self-pay | Admitting: Student

## 2021-09-22 DIAGNOSIS — M7501 Adhesive capsulitis of right shoulder: Secondary | ICD-10-CM

## 2021-09-22 DIAGNOSIS — S42291D Other displaced fracture of upper end of right humerus, subsequent encounter for fracture with routine healing: Secondary | ICD-10-CM

## 2021-09-25 DIAGNOSIS — T792XXD Traumatic secondary and recurrent hemorrhage and seroma, subsequent encounter: Secondary | ICD-10-CM | POA: Diagnosis not present

## 2021-10-03 DIAGNOSIS — R399 Unspecified symptoms and signs involving the genitourinary system: Secondary | ICD-10-CM | POA: Diagnosis not present

## 2021-10-10 ENCOUNTER — Ambulatory Visit
Admission: RE | Admit: 2021-10-10 | Discharge: 2021-10-10 | Disposition: A | Discharge status: Home / Self Care | Payer: Self-pay | Source: Ambulatory Visit | Attending: Student | Admitting: Student

## 2021-10-10 DIAGNOSIS — S42251A Displaced fracture of greater tuberosity of right humerus, initial encounter for closed fracture: Secondary | ICD-10-CM | POA: Diagnosis not present

## 2021-10-10 DIAGNOSIS — S42291D Other displaced fracture of upper end of right humerus, subsequent encounter for fracture with routine healing: Secondary | ICD-10-CM

## 2021-10-10 DIAGNOSIS — M7501 Adhesive capsulitis of right shoulder: Secondary | ICD-10-CM

## 2021-10-10 DIAGNOSIS — R531 Weakness: Secondary | ICD-10-CM | POA: Diagnosis not present

## 2021-10-10 DIAGNOSIS — S42211A Unspecified displaced fracture of surgical neck of right humerus, initial encounter for closed fracture: Secondary | ICD-10-CM | POA: Diagnosis not present

## 2021-10-10 IMAGING — MR MR SHOULDER*R* W/O CM
4 of 5 series · 28 of 40 positions shown · non-contrast
Comparison: Right shoulder radiographs [DATE]; MRI right
shoulder [DATE]

CLINICAL DATA: Right arm weakness and inability to lift right arm.
Fracture in [DATE].

EXAM:
MRI OF THE RIGHT SHOULDER WITHOUT CONTRAST
TECHNIQUE: Multiplanar, multisequence MR imaging of the shoulder was performed.
No intravenous contrast was administered.

[Series 10: T2 fat-sat · axial · right · 4.0mm · 0.27mm/px · z∈[-94,+11]mm · 8 of 23 slices shown (1 of 3)]
[im 1/23]
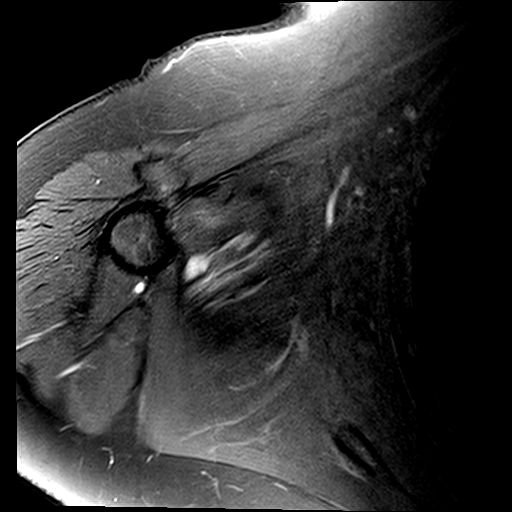
[im 4/23]
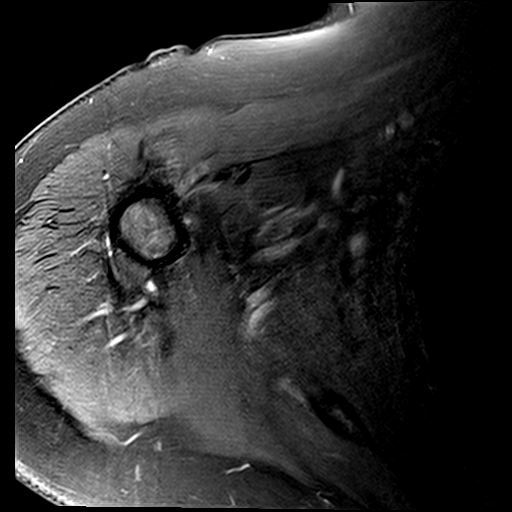
[im 7/23]
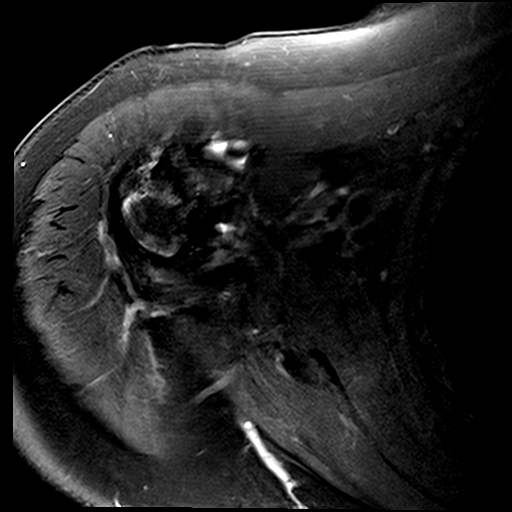
[im 10/23]
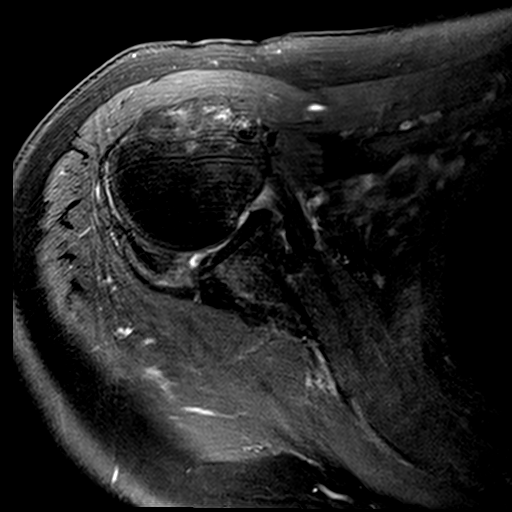
[im 13/23]
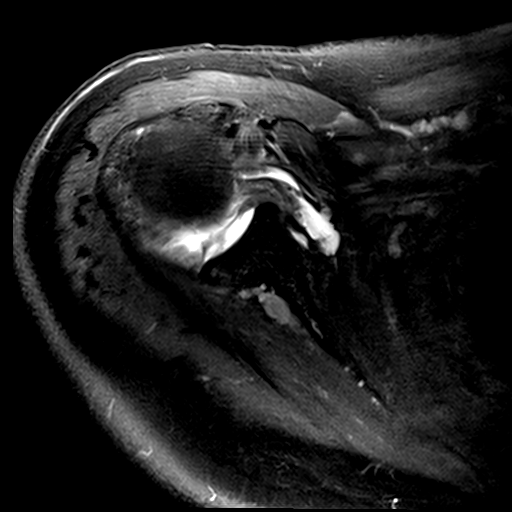
[im 16/23]
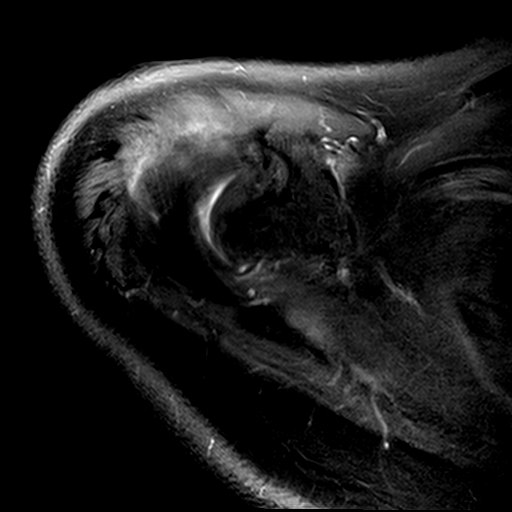
[im 19/23]
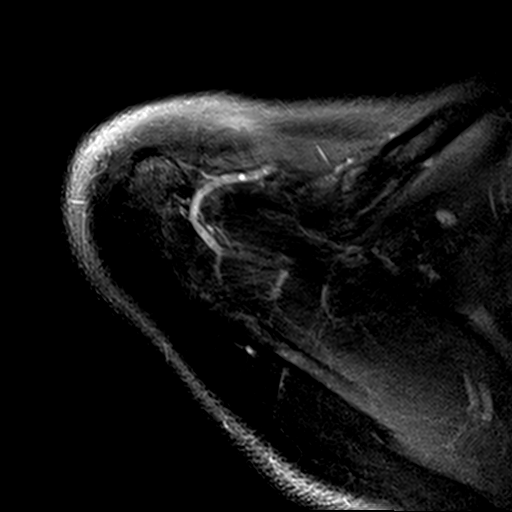
[im 23/23]
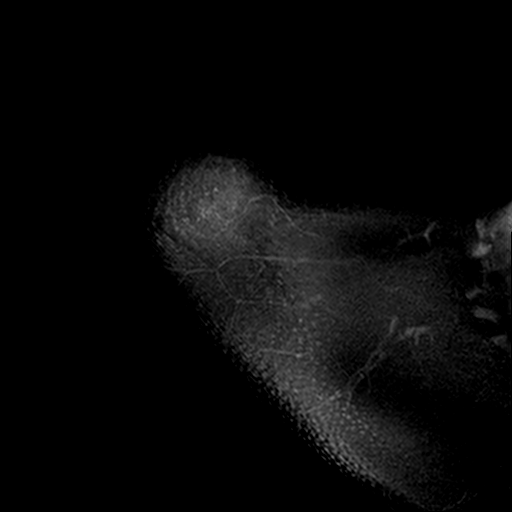

[Series 12: PD · oblique · right · 4.0mm · 0.55mm/px · 8 of 19 slices shown]
[im 1/19]
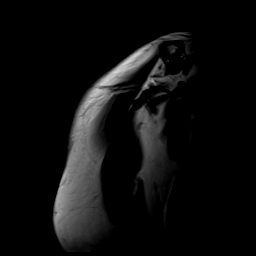
[im 3/19]
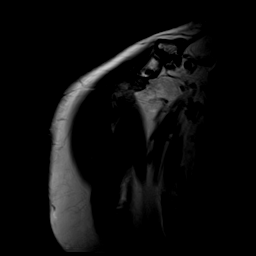
[im 6/19]
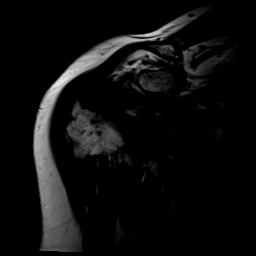
[im 8/19]
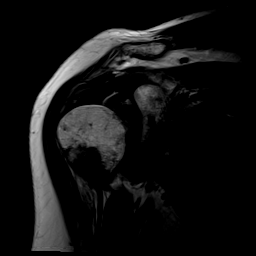
[im 11/19]
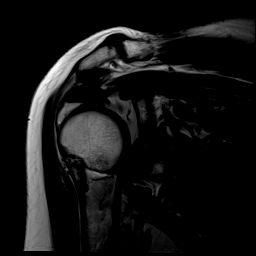
[im 13/19]
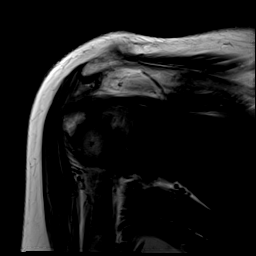
[im 16/19]
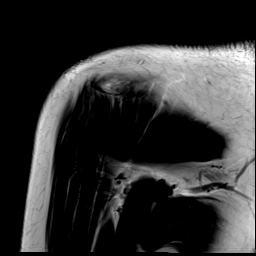
[im 19/19]
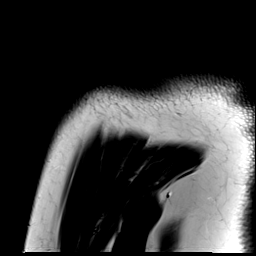

[Series 13: T2 fat-sat · oblique · right · 4.0mm · 0.55mm/px · 8 of 19 slices shown (2 of 3)]
[im 1/19]
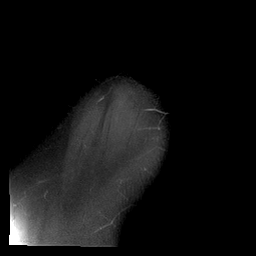
[im 3/19]
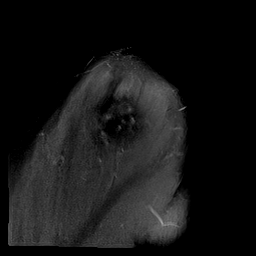
[im 6/19]
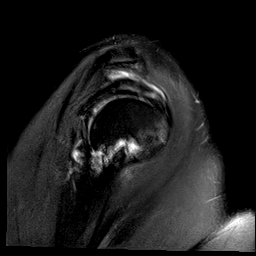
[im 8/19]
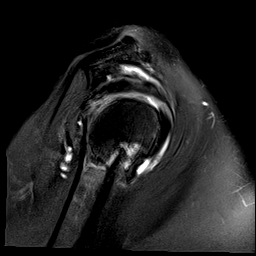
[im 11/19]
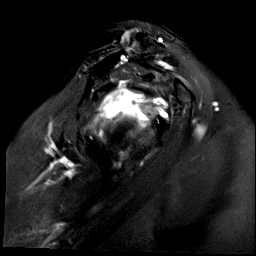
[im 13/19]
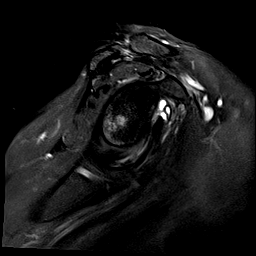
[im 16/19]
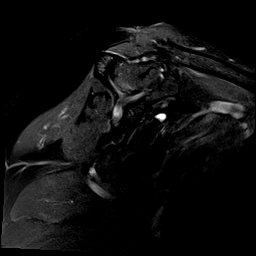
[im 19/19]
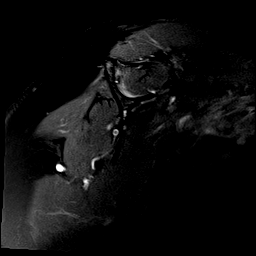

[Series 15: T2 fat-sat · oblique · right · 4.0mm · 0.27mm/px · 4 of 19 slices shown (3 of 3)]
[im 1/19]
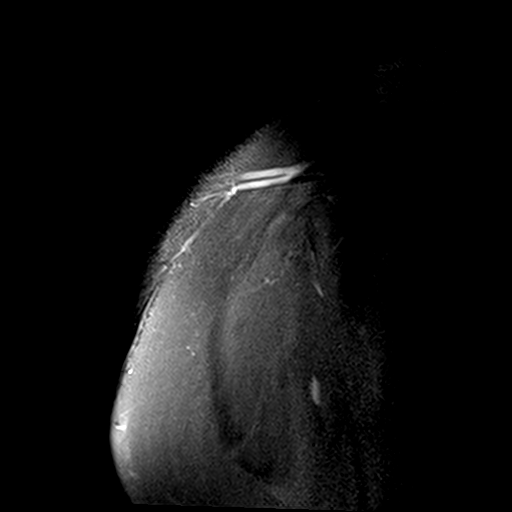
[im 3/19]
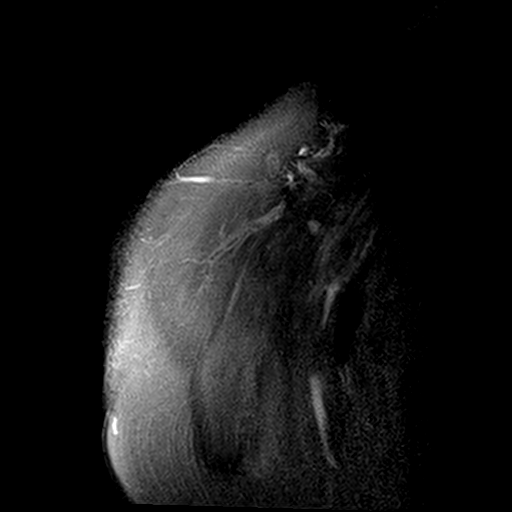
[im 11/19]
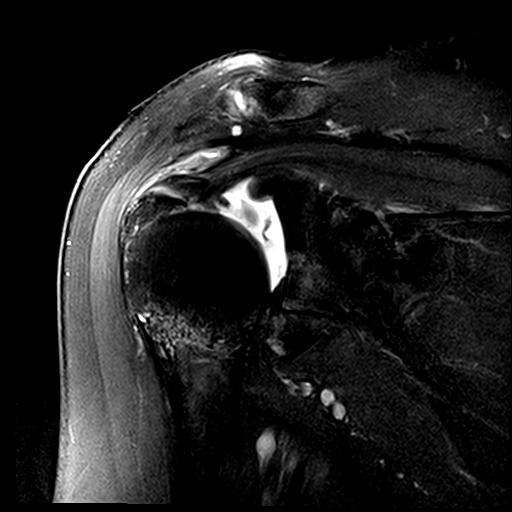
[im 16/19]
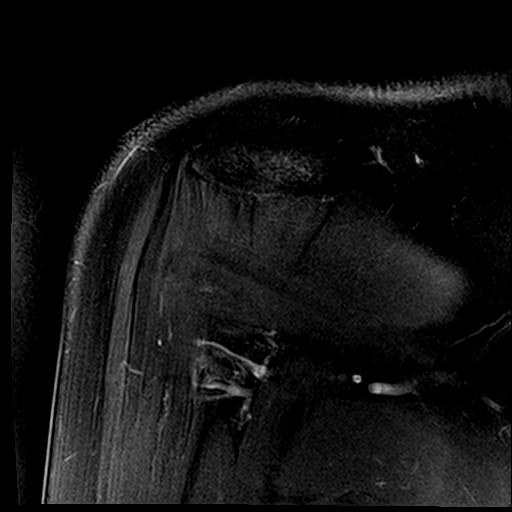

[28 of 40 positions shown; findings below may reference images not displayed]

FINDINGS: Rotator cuff: Redemonstration of superiorly displaced avulsion
fracture of the greater tuberosity diffusely throughout the
posterior 50% of the supraspinatus tendon insertion and the entire
infraspinatus tendon insertion. There is moderate intermediate T2
signal tendinosis of the anterior supraspinatus tendon insertion on
this fracture fragment (coronal series 15, image 12). There is an
increased T2 signal near fluid bright likely chronic horizontal
linear tear within the majority of the midsubstance of the posterior
supraspinatus and anterior infraspinatus tendon footprint fibers on
this greater tuberosity fracture fragment, measuring up to 9 mm in
AP dimension (sagittal image 5) and 8 mm in transverse dimension
(coronal image 9). Small linear intrasubstance tear within the
superior subscapularis tendon is unchanged. The teres minor is
intact.

Muscles: No rotator cuff muscle atrophy, fatty infiltration, or
edema.

Biceps long head: No biceps tendon tear is visualized. Mild proximal
long head of the biceps intermediate T2 signal tendinosis proximal
to the bicipital groove, new from prior.

Acromioclavicular Joint: There are mild degenerative changes of the
acromioclavicular joint including joint space narrowing, subchondral
marrow edema, and peripheral osteophytosis. Type II acromion. Mild
subacromial/subdeltoid bursitis decreased from prior.

Glenohumeral Joint: There is minimal 1 mm cortical step-off within
the superomedial humeral head (coronal image 11), more smooth
compared to prior and a now healed articular fracture. Mild
superomedial humeral head cartilage thinning. Moderate mid to
posterior glenoid cartilage thinning with subchondral marrow edema
(axial series 2, image 12).

Labrum: Mild attenuation and peripheral irregularity of the
posterosuperior glenoid labrum.

Bones: Redemonstration of complete transverse fracture of the
surgical neck of the humerus. 1.2 cm posterior displacement of the
distal fracture component is unchanged. 7 to 9 mm lateral
displacement of the distal fracture component appears mildly
improved from prior. There is increased T2 signal throughout the
fracture line, which appears non fused.

Other: None.
IMPRESSION: 1. Redemonstration of now subacute to chronic complete displaced
fracture of the surgical neck of the humerus. There is non fusion of
the fracture line with persistent fluid bright signal across the
fracture fracture line. Unchanged posterior displacement and
slightly improved lateral displacement of the distal fracture
component.
2. Superiorly displaced avulsion fracture of the greater tuberosity
throughout the posterior 50% of the supraspinatus tendon insertion
of the entire infraspinatus tendon insertion. Horizontal linear
partial-thickness midsubstance tear within the insertion of the
posterior supraspinatus and anterior infraspinatus tendon footprint
interdigitating fibers on the displaced greater tuberosity fracture
fragment, measuring up to 9 mm in AP dimension and 8 mm in
transverse dimension.
3. Unchanged small linear intrasubstance tear within the superior
subscapularis tendon.
4. Mild proximal long head of the biceps tendinosis, new from prior.

## 2021-11-06 ENCOUNTER — Encounter: Payer: Self-pay | Admitting: Gastroenterology

## 2021-12-01 DIAGNOSIS — S42291G Other displaced fracture of upper end of right humerus, subsequent encounter for fracture with delayed healing: Secondary | ICD-10-CM | POA: Diagnosis not present

## 2021-12-09 ENCOUNTER — Ambulatory Visit (AMBULATORY_SURGERY_CENTER): Payer: Self-pay | Admitting: *Deleted

## 2021-12-09 VITALS — Ht 66.0 in | Wt 167.4 lb

## 2021-12-09 DIAGNOSIS — Z86010 Personal history of colonic polyps: Secondary | ICD-10-CM

## 2021-12-09 MED ORDER — NA SULFATE-K SULFATE-MG SULF 17.5-3.13-1.6 GM/177ML PO SOLN: 1.0000 | Freq: Once | ORAL | 0 refills | Status: AC

## 2021-12-09 NOTE — Progress Notes (Signed)
No egg or soy allergy known to patient  No issues known to pt with past sedation with any surgeries or procedures Patient denies ever being told they had issues or difficulty with intubation  No FH of Malignant Hyperthermia Pt is not on diet pills Pt is not on  home 02  Pt is not on blood thinners  Pt denies issues with constipation  No A fib or A flutter Have any cardiac testing pending--no Pt instructed to use Single[employer] or GoodRx for a price reduction on prep    Pt. Has a OV with cardiology no pending procedure scheduled instructed pt. To call office to inform us if there are any changes,she verbalized understanding.

## 2021-12-12 DIAGNOSIS — R399 Unspecified symptoms and signs involving the genitourinary system: Secondary | ICD-10-CM | POA: Diagnosis not present

## 2021-12-19 DIAGNOSIS — Z0000 Encounter for general adult medical examination without abnormal findings: Secondary | ICD-10-CM | POA: Diagnosis not present

## 2021-12-19 DIAGNOSIS — Z79899 Other long term (current) drug therapy: Secondary | ICD-10-CM | POA: Diagnosis not present

## 2021-12-29 DIAGNOSIS — R002 Palpitations: Secondary | ICD-10-CM | POA: Diagnosis not present

## 2021-12-29 DIAGNOSIS — Z87891 Personal history of nicotine dependence: Secondary | ICD-10-CM | POA: Diagnosis not present

## 2022-01-01 ENCOUNTER — Encounter: Payer: Self-pay | Admitting: Gastroenterology

## 2022-01-02 ENCOUNTER — Telehealth: Payer: Self-pay | Admitting: Gastroenterology

## 2022-01-02 NOTE — Telephone Encounter (Signed)
Spoke with patient, she has had "indigestion" for the past 3 days and yesterday she had "loose bowels and bloating". She wants to know if she can take medication like tums for the indigestion and wanted to make sure it was okay to take prep for colon on Tuesday. Explained to patient that she can take tums and okay to proceed as scheduled. Patient did see cardiologist but was told "everything was normal"-no test ordered. Pt denies any cardiac symptoms at this time.   Dr.Gupta, Okay to proceed on 8/8 with colonoscopy? Please advise. Thank you, Vickie Drake pv

## 2022-01-02 NOTE — Telephone Encounter (Signed)
Patient has a procedure Tuesday with Dr. Lyndel Safe.  For the past 3 days, she has been having extreme heartburn and indigestion and she wanted to know if she should still plan on coming for the procedure.  Please call patient and advise.  Thank you.

## 2022-01-05 NOTE — Telephone Encounter (Signed)
She can take Tums as needed Okay to proceed with colonoscopy RG

## 2022-01-06 ENCOUNTER — Ambulatory Visit (AMBULATORY_SURGERY_CENTER): Payer: 59 | Admitting: Gastroenterology

## 2022-01-06 ENCOUNTER — Encounter: Payer: Self-pay | Admitting: Gastroenterology

## 2022-01-06 VITALS — BP 121/77 | HR 74 | Temp 96.8°F | Resp 13 | Ht 66.0 in | Wt 167.4 lb

## 2022-01-06 DIAGNOSIS — Z09 Encounter for follow-up examination after completed treatment for conditions other than malignant neoplasm: Secondary | ICD-10-CM

## 2022-01-06 DIAGNOSIS — D120 Benign neoplasm of cecum: Secondary | ICD-10-CM

## 2022-01-06 DIAGNOSIS — Z86010 Personal history of colonic polyps: Secondary | ICD-10-CM

## 2022-01-06 MED ORDER — SODIUM CHLORIDE 0.9 % IV SOLN: 500.0000 mL | Freq: Once | INTRAVENOUS | Status: DC

## 2022-01-06 NOTE — Progress Notes (Signed)
Pt's states no medical or surgical changes since previsit or office visit. 

## 2022-01-06 NOTE — Progress Notes (Signed)
PT taken to PACU. Monitors in place. VSS. Report given to RN. 

## 2022-01-06 NOTE — Op Note (Signed)
[facility] Patient Name: Vickie Drake Procedure Date: 01/06/2022 9:06 AM MRN: [id number] Endoscopist: Jackquline Denmark , MD Age: 61 Referring MD:  Date of Birth: [birth year] Gender: Female Account #: [id number] Procedure:                Colonoscopy Indications:              High risk colon cancer surveillance: Personal                            history of colonic polyps Medicines:                Monitored Anesthesia Care Procedure:                Pre-Anesthesia Assessment:                           - Prior to the procedure, a History and Physical                            was performed, and patient medications and                            allergies were reviewed. The patient's tolerance of                            previous anesthesia was also reviewed. The risks                            and benefits of the procedure and the sedation                            options and risks were discussed with the patient.                            All questions were answered, and informed consent                            was obtained. Prior Anticoagulants: The patient has                            taken no previous anticoagulant or antiplatelet                            agents. ASA Grade Assessment: II - A patient with                            mild systemic disease. After reviewing the risks                            and benefits, the patient was deemed in                            satisfactory condition to undergo the procedure.  After obtaining informed consent, the colonoscope                            was passed under direct vision. Throughout the                            procedure, the patient's blood pressure, pulse, and                            oxygen saturations were monitored continuously. The                            Olympus PCF-H190DL [id number]) Colonoscope was                            introduced through the anus and advanced to  the the                            cecum, identified by appendiceal orifice and                            ileocecal valve. The colonoscopy was performed                            without difficulty. The patient tolerated the                            procedure well. The quality of the bowel                            preparation was good. The ileocecal valve,                            appendiceal orifice, and rectum were photographed. Scope In: 9:12:15 AM Scope Out: 9:25:10 AM Scope Withdrawal Time: 0 hours 7 minutes 21 seconds  Total Procedure Duration: 0 hours 12 minutes 55 seconds  Findings:                 A 6 mm polyp was found in the cecum. The polyp was                            sessile. The polyp was removed with a cold snare.                            Resection and retrieval were complete.                           Non-bleeding internal hemorrhoids were found during                            retroflexion. The hemorrhoids were Grade I                            (internal hemorrhoids that do not prolapse).  The exam was otherwise without abnormality on                            direct and retroflexion views. Complications:            No immediate complications. Estimated Blood Loss:     Estimated blood loss: none. Impression:               - One 6 mm polyp in the cecum, removed with a cold                            snare. Resected and retrieved.                           - The examination was otherwise normal on direct                            and retroflexion views. Recommendation:           - Patient has a contact number available for                            emergencies. The signs and symptoms of potential                            delayed complications were discussed with the                            patient. Return to normal activities tomorrow.                            Written discharge instructions were provided to the                             patient.                           - Resume previous diet.                           - Continue present medications.                           - Await pathology results.                           - Repeat colonoscopy for surveillance based on                            pathology results.                           - The findings and recommendations were discussed                            with the patient's family. Jackquline Denmark, MD 01/06/2022 9:29:21 AM This report has been signed electronically.

## 2022-01-06 NOTE — Patient Instructions (Signed)
Handouts on polyps and hemorrhoids given to you today    YOU HAD AN ENDOSCOPIC PROCEDURE TODAY AT [facility]:   Refer to the procedure report that was given to you for any specific questions about what was found during the examination.  If the procedure report does not answer your questions, please call your gastroenterologist to clarify.  If you requested that your care partner not be given the details of your procedure findings, then the procedure report has been included in a sealed envelope for you to review at your convenience later.  YOU SHOULD EXPECT: Some feelings of bloating in the abdomen. Passage of more gas than usual.  Walking can help get rid of the air that was put into your GI tract during the procedure and reduce the bloating. If you had a lower endoscopy (such as a colonoscopy or flexible sigmoidoscopy) you may notice spotting of blood in your stool or on the toilet paper. If you underwent a bowel prep for your procedure, you may not have a normal bowel movement for a few days.  Please Note:  You might notice some irritation and congestion in your nose or some drainage.  This is from the oxygen used during your procedure.  There is no need for concern and it should clear up in a day or so.  SYMPTOMS TO REPORT IMMEDIATELY:  Following lower endoscopy (colonoscopy or flexible sigmoidoscopy):  Excessive amounts of blood in the stool  Significant tenderness or worsening of abdominal pains  Swelling of the abdomen that is new, acute  Fever of 100F or higher  For urgent or emergent issues, a gastroenterologist can be reached at any hour by calling [phone]. Do not use MyChart messaging for urgent concerns.    DIET:  We do recommend a small meal at first, but then you may proceed to your regular diet.  Drink plenty of fluids but you should avoid alcoholic beverages for 24 hours.  ACTIVITY:  You should plan to take it easy for the rest of today and you should  NOT DRIVE or use heavy machinery until tomorrow (because of the sedation medicines used during the test).    FOLLOW UP: Our staff will call the number listed on your records the next business day following your procedure.  We will call around 7:15- 8:00 am to check on you and address any questions or concerns that you may have regarding the information given to you following your procedure. If we do not reach you, we will leave a message.  If you develop any symptoms (ie: fever, flu-like symptoms, shortness of breath, cough etc.) before then, please call [phone].  If you test positive for Covid 19 in the 2 weeks post procedure, please call and report this information to [place].    If any biopsies were taken you will be contacted by phone or by letter within the next 1-3 weeks.  Please call us at [phone] if you have not heard about the biopsies in 3 weeks.    SIGNATURES/CONFIDENTIALITY: You and/or your care partner have signed paperwork which will be entered into your electronic medical record.  These signatures attest to the fact that that the information above on your After Visit Summary has been reviewed and is understood.  Full responsibility of the confidentiality of this discharge information lies with you and/or your care-partner.

## 2022-01-06 NOTE — Telephone Encounter (Signed)
Today is date for Colonoscopy: Pt arrived already

## 2022-01-06 NOTE — Progress Notes (Signed)
Called to room to assist during endoscopic procedure.  Patient ID and intended procedure confirmed with present staff. Received instructions for my participation in the procedure from the performing physician.  

## 2022-01-06 NOTE — Progress Notes (Signed)
[facility] History and Physical   Primary Care Physician:  Baxter Hire, MD   Reason for Procedure:   H/O polyps  Plan:    colon     HPI: Vickie Drake is a 61 y.o. female    Past Medical History:  Diagnosis Date   Allergy    seasonal   Anxiety    Arthritis    in hands   Fall from horse 11/2012   left leg injury/sligth concussion   Fatigue    GERD (gastroesophageal reflux disease)    sometimes   Gross hematuria 2015   History of panic attacks    in the past/no meds   Hypothyroidism    borderline,no medication updated 12/09/21   MVP (mitral valve prolapse)    Vitamin B12 deficiency     Past Surgical History:  Procedure Laterality Date   ABDOMINAL HYSTERECTOMY     APPENDECTOMY     BREAST BIOPSY     left breast/benign   BREAST LUMPECTOMY     left breast   COLONOSCOPY  05/2018   patient had 3 polyps - Dr. Lyndel Safe in [place] Left    age 36,cyst removed   POLYPECTOMY      Prior to Admission medications   Medication Sig Start Date End Date Taking? Authorizing Provider  aspirin 81 MG chewable tablet Chew 1 tablet every day by oral route.   Yes [provider]  BIOTIN PO Take 5,000 mcg by mouth daily. Reported on 07/29/2015   Yes [provider]  Cholecalciferol (VITAMIN D PO) Take by mouth daily.   Yes [provider]  Cholecalciferol (VITAMIN D-3 PO) Take by mouth. Take 2000 iu every other day   Yes [provider]  Fluticasone Propionate (FLONASE ALLERGY RELIEF NA) Place into the nose daily.   Yes [provider]  Multiple Vitamin (MULTIVITAMIN) capsule Take by mouth daily.   Yes [provider]  Omega-3 Fatty Acids (OMEGA-3 FISH OIL PO) Take 600 mg by mouth daily.   Yes [provider]  vitamin B-12 (CYANOCOBALAMIN) 500 MCG tablet Take 1,000 mcg by mouth daily.   Yes [provider]  vitamin E 1000 UNIT capsule Take 1,000 Units by mouth daily.    Yes [provider]  benzonatate (TESSALON) 200 MG capsule Take 1 capsule (200 mg total) by mouth 2 (two) times daily as needed for cough. Patient not taking: Reported on 12/09/2021 07/20/18   Fredderick Severance, NP  guaiFENesin (MUCINEX) 600 MG 12 hr tablet Take 1 tablet (600 mg total) by mouth 2 (two) times daily. Patient not taking: Reported on 12/09/2021 07/20/18   Fredderick Severance, NP    Current Outpatient Medications  Medication Sig Dispense Refill   aspirin 81 MG chewable tablet Chew 1 tablet every day by oral route.     BIOTIN PO Take 5,000 mcg by mouth daily. Reported on 07/29/2015     Cholecalciferol (VITAMIN D PO) Take by mouth daily.     Cholecalciferol (VITAMIN D-3 PO) Take by mouth. Take 2000 iu every other day     Fluticasone Propionate (FLONASE ALLERGY RELIEF NA) Place into the nose daily.     Multiple Vitamin (MULTIVITAMIN) capsule Take by mouth daily.     Omega-3 Fatty Acids (OMEGA-3 FISH OIL PO) Take 600 mg by mouth daily.     vitamin B-12 (CYANOCOBALAMIN) 500 MCG tablet Take 1,000 mcg by mouth daily.     vitamin E 1000 UNIT capsule  Take 1,000 Units by mouth daily.     benzonatate (TESSALON) 200 MG capsule Take 1 capsule (200 mg total) by mouth 2 (two) times daily as needed for cough. (Patient not taking: Reported on 12/09/2021) 30 capsule 1   guaiFENesin (MUCINEX) 600 MG 12 hr tablet Take 1 tablet (600 mg total) by mouth 2 (two) times daily. (Patient not taking: Reported on 12/09/2021) 30 tablet 0   Current Facility-Administered Medications  Medication Dose Route Frequency Provider Last Rate Last Admin   0.9 %  sodium chloride infusion  500 mL Intravenous Once Jackquline Denmark, MD        Allergies as of 01/06/2022 - Review Complete 01/06/2022  Allergen Reaction Noted   Tetracycline Other (See Comments) 01/29/2015   Cephalexin Other (See Comments) 01/29/2015    Family History  Problem Relation Age of Onset   Diabetes Mother    Cancer Mother        breast    Breast cancer Mother    Heart disease Father    Heart attack Father    Hypertension Father    Diabetes Sister    Diabetes Brother    Colon cancer Neg Hx    Esophageal cancer Neg Hx    Stomach cancer Neg Hx    Rectal cancer Neg Hx    Colon polyps Neg Hx    Crohn's disease Neg Hx    Ulcerative colitis Neg Hx     Social History   Socioeconomic History   Marital status: Married    Spouse name: Manolis   Number of children: 2   Years of education: 14   Highest education level: Some college, no degree  Occupational History   Occupation: Armed forces operational officer  Tobacco Use   Smoking status: Former    Types: Cigarettes    Quit date: 1980    Years since quitting: 43.6    Passive exposure: Never   Smokeless tobacco: Never   Tobacco comments:    5 years of smoking at age of 29  Vaping Use   Vaping Use: Never used  Substance and Sexual Activity   Alcohol use: Yes    Alcohol/week: 7.0 standard drinks of alcohol    Types: 7 Glasses of wine per week    Comment: wine only daily   Drug use: No   Sexual activity: Yes    Partners: Male    Birth control/protection: None  Other Topics Concern   Not on file  Social History Narrative   Not on file   Social Determinants of Health   Financial Resource Strain: Low Risk  (04/25/2018)   Overall Financial Resource Strain (CARDIA)    Difficulty of Paying Living Expenses: Not hard at all  Food Insecurity: No Food Insecurity (04/25/2018)   Hunger Vital Sign    Worried About Running Out of Food in the Last Year: Never true    Terry in the Last Year: Never true  Transportation Needs: No Transportation Needs (04/25/2018)   PRAPARE - Hydrologist (Medical): No    Lack of Transportation (Non-Medical): No  Physical Activity: Sufficiently Active (04/25/2018)   Exercise Vital Sign    Days of Exercise per Week: 7 days    Minutes of Exercise per Session: 60 min  Stress: No Stress Concern Present (04/25/2018)    [place]    Feeling of Stress : Not at all  Social Connections: Moderately Integrated (04/25/2018)  Social Licensed conveyancer [NHANES]    Frequency of Communication with Friends and Family: More than three times a week    Frequency of Social Gatherings with Friends and Family: More than three times a week    Attends Religious Services: Never    Marine scientist or Organizations: Yes    Attends Archivist Meetings: Never    Marital Status: Married  Human resources officer Violence: Not At Risk (04/25/2018)   Humiliation, Afraid, Rape, and Kick questionnaire    Fear of Current or Ex-Partner: No    Emotionally Abused: No    Physically Abused: No    Sexually Abused: No    Review of Systems: Positive for none All other review of systems negative except as mentioned in the HPI.  Physical Exam: Vital signs in last 24 hours: '@VSRANGES'$ @   General:   Alert,  Well-developed, well-nourished, pleasant and cooperative in NAD Lungs:  Clear throughout to auscultation.   Heart:  Regular rate and rhythm; no murmurs, clicks, rubs,  or gallops. Abdomen:  Soft, nontender and nondistended. Normal bowel sounds.   Neuro/Psych:  Alert and cooperative. Normal mood and affect. A and O x 3    No significant changes were identified.  The patient continues to be an appropriate candidate for the planned procedure and anesthesia.   Carmell Austria, MD. [facility] 01/06/2022 9:01 AM@

## 2022-01-07 ENCOUNTER — Telehealth: Payer: Self-pay

## 2022-01-07 NOTE — Telephone Encounter (Signed)
  Follow up Call-     01/06/2022    8:14 AM  Call back number  Post procedure Call Back phone  # [phone]  Permission to leave phone message Yes     Patient questions:  Do you have a fever, pain , or abdominal swelling? No. Pain Score  0 *  Have you tolerated food without any problems? Yes.    Have you been able to return to your normal activities? Yes.    Do you have any questions about your discharge instructions: Diet   No. Medications  No. Follow up visit  No.  Do you have questions or concerns about your Care? No.  Actions: * If pain score is 4 or above: No action needed, pain <4.

## 2022-01-08 DIAGNOSIS — Z1231 Encounter for screening mammogram for malignant neoplasm of breast: Secondary | ICD-10-CM | POA: Diagnosis not present

## 2022-01-08 DIAGNOSIS — Z01419 Encounter for gynecological examination (general) (routine) without abnormal findings: Secondary | ICD-10-CM | POA: Diagnosis not present

## 2022-01-08 DIAGNOSIS — Z780 Asymptomatic menopausal state: Secondary | ICD-10-CM | POA: Diagnosis not present

## 2022-01-09 ENCOUNTER — Encounter: Payer: Self-pay | Admitting: Gastroenterology

## 2022-01-15 ENCOUNTER — Other Ambulatory Visit: Payer: Self-pay | Admitting: Gynecology

## 2022-01-15 DIAGNOSIS — R928 Other abnormal and inconclusive findings on diagnostic imaging of breast: Secondary | ICD-10-CM

## 2022-01-29 ENCOUNTER — Other Ambulatory Visit: Payer: Self-pay | Admitting: Gynecology

## 2022-01-29 ENCOUNTER — Ambulatory Visit
Admission: RE | Admit: 2022-01-29 | Discharge: 2022-01-29 | Disposition: A | Discharge status: Home / Self Care | Payer: 59 | Source: Ambulatory Visit | Attending: Gynecology | Admitting: Gynecology

## 2022-01-29 DIAGNOSIS — R928 Other abnormal and inconclusive findings on diagnostic imaging of breast: Secondary | ICD-10-CM

## 2022-01-29 DIAGNOSIS — N6320 Unspecified lump in the left breast, unspecified quadrant: Secondary | ICD-10-CM

## 2022-01-29 DIAGNOSIS — N6001 Solitary cyst of right breast: Secondary | ICD-10-CM | POA: Diagnosis not present

## 2022-01-29 DIAGNOSIS — R922 Inconclusive mammogram: Secondary | ICD-10-CM | POA: Diagnosis not present

## 2022-02-03 ENCOUNTER — Ambulatory Visit
Admission: RE | Admit: 2022-02-03 | Discharge: 2022-02-03 | Disposition: A | Discharge status: Home / Self Care | Payer: 59 | Source: Ambulatory Visit | Attending: Gynecology | Admitting: Gynecology

## 2022-02-03 ENCOUNTER — Other Ambulatory Visit: Payer: Self-pay | Admitting: Gynecology

## 2022-02-03 DIAGNOSIS — N6320 Unspecified lump in the left breast, unspecified quadrant: Secondary | ICD-10-CM

## 2022-02-03 DIAGNOSIS — R922 Inconclusive mammogram: Secondary | ICD-10-CM | POA: Diagnosis not present

## 2022-02-13 DIAGNOSIS — S42291G Other displaced fracture of upper end of right humerus, subsequent encounter for fracture with delayed healing: Secondary | ICD-10-CM | POA: Diagnosis not present

## 2022-03-09 DIAGNOSIS — R030 Elevated blood-pressure reading, without diagnosis of hypertension: Secondary | ICD-10-CM | POA: Diagnosis not present

## 2022-03-23 DIAGNOSIS — G4700 Insomnia, unspecified: Secondary | ICD-10-CM | POA: Diagnosis not present

## 2022-03-23 DIAGNOSIS — R030 Elevated blood-pressure reading, without diagnosis of hypertension: Secondary | ICD-10-CM | POA: Diagnosis not present

## 2022-03-23 DIAGNOSIS — R002 Palpitations: Secondary | ICD-10-CM | POA: Diagnosis not present

## 2022-03-23 DIAGNOSIS — Z23 Encounter for immunization: Secondary | ICD-10-CM | POA: Diagnosis not present

## 2022-03-30 DIAGNOSIS — R002 Palpitations: Secondary | ICD-10-CM | POA: Diagnosis not present

## 2022-03-30 DIAGNOSIS — R030 Elevated blood-pressure reading, without diagnosis of hypertension: Secondary | ICD-10-CM | POA: Diagnosis not present

## 2022-06-11 DIAGNOSIS — R030 Elevated blood-pressure reading, without diagnosis of hypertension: Secondary | ICD-10-CM | POA: Diagnosis not present

## 2022-06-11 DIAGNOSIS — R002 Palpitations: Secondary | ICD-10-CM | POA: Diagnosis not present

## 2022-08-07 DIAGNOSIS — R002 Palpitations: Secondary | ICD-10-CM | POA: Diagnosis not present

## 2022-09-22 DIAGNOSIS — R002 Palpitations: Secondary | ICD-10-CM | POA: Diagnosis not present

## 2022-10-05 DIAGNOSIS — R002 Palpitations: Secondary | ICD-10-CM | POA: Diagnosis not present

## 2022-10-05 DIAGNOSIS — R030 Elevated blood-pressure reading, without diagnosis of hypertension: Secondary | ICD-10-CM | POA: Diagnosis not present

## 2022-11-20 DIAGNOSIS — S61251A Open bite of left index finger without damage to nail, initial encounter: Secondary | ICD-10-CM | POA: Diagnosis not present

## 2022-11-20 DIAGNOSIS — W5501XA Bitten by cat, initial encounter: Secondary | ICD-10-CM | POA: Diagnosis not present

## 2022-11-20 DIAGNOSIS — Z23 Encounter for immunization: Secondary | ICD-10-CM | POA: Diagnosis not present

## 2022-11-20 DIAGNOSIS — S61052A Open bite of left thumb without damage to nail, initial encounter: Secondary | ICD-10-CM | POA: Diagnosis not present

## 2022-12-23 DIAGNOSIS — Z0000 Encounter for general adult medical examination without abnormal findings: Secondary | ICD-10-CM | POA: Diagnosis not present

## 2022-12-30 DIAGNOSIS — Z0001 Encounter for general adult medical examination with abnormal findings: Secondary | ICD-10-CM | POA: Diagnosis not present

## 2022-12-30 DIAGNOSIS — Z1339 Encounter for screening examination for other mental health and behavioral disorders: Secondary | ICD-10-CM | POA: Diagnosis not present

## 2022-12-30 DIAGNOSIS — E559 Vitamin D deficiency, unspecified: Secondary | ICD-10-CM | POA: Diagnosis not present

## 2022-12-30 DIAGNOSIS — Z1331 Encounter for screening for depression: Secondary | ICD-10-CM | POA: Diagnosis not present

## 2023-04-06 DIAGNOSIS — R399 Unspecified symptoms and signs involving the genitourinary system: Secondary | ICD-10-CM | POA: Diagnosis not present

## 2023-04-13 DIAGNOSIS — J40 Bronchitis, not specified as acute or chronic: Secondary | ICD-10-CM | POA: Diagnosis not present

## 2023-04-23 DIAGNOSIS — R300 Dysuria: Secondary | ICD-10-CM | POA: Diagnosis not present

## 2023-04-23 DIAGNOSIS — R52 Pain, unspecified: Secondary | ICD-10-CM | POA: Diagnosis not present

## 2023-04-23 DIAGNOSIS — R399 Unspecified symptoms and signs involving the genitourinary system: Secondary | ICD-10-CM | POA: Diagnosis not present

## 2023-04-28 DIAGNOSIS — Z01419 Encounter for gynecological examination (general) (routine) without abnormal findings: Secondary | ICD-10-CM | POA: Diagnosis not present

## 2023-04-28 DIAGNOSIS — Z1389 Encounter for screening for other disorder: Secondary | ICD-10-CM | POA: Diagnosis not present

## 2023-04-28 DIAGNOSIS — Z1231 Encounter for screening mammogram for malignant neoplasm of breast: Secondary | ICD-10-CM | POA: Diagnosis not present

## 2023-04-28 DIAGNOSIS — Z01411 Encounter for gynecological examination (general) (routine) with abnormal findings: Secondary | ICD-10-CM | POA: Diagnosis not present

## 2023-04-28 DIAGNOSIS — N393 Stress incontinence (female) (male): Secondary | ICD-10-CM | POA: Diagnosis not present

## 2023-04-28 DIAGNOSIS — Z780 Asymptomatic menopausal state: Secondary | ICD-10-CM | POA: Diagnosis not present

## 2023-04-28 DIAGNOSIS — N952 Postmenopausal atrophic vaginitis: Secondary | ICD-10-CM | POA: Diagnosis not present

## 2023-04-28 DIAGNOSIS — Z130 Encounter for screening for diseases of the blood and blood-forming organs and certain disorders involving the immune mechanism: Secondary | ICD-10-CM | POA: Diagnosis not present

## 2023-07-08 DIAGNOSIS — R109 Unspecified abdominal pain: Secondary | ICD-10-CM | POA: Diagnosis not present

## 2023-07-08 DIAGNOSIS — R1032 Left lower quadrant pain: Secondary | ICD-10-CM | POA: Diagnosis not present

## 2023-07-28 ENCOUNTER — Ambulatory Visit: Payer: 59 | Admitting: Dermatology

## 2023-08-03 ENCOUNTER — Encounter: Payer: Self-pay | Admitting: Dermatology

## 2023-08-03 ENCOUNTER — Ambulatory Visit: Payer: 59 | Admitting: Dermatology

## 2023-08-03 DIAGNOSIS — W908XXA Exposure to other nonionizing radiation, initial encounter: Secondary | ICD-10-CM

## 2023-08-03 DIAGNOSIS — D1801 Hemangioma of skin and subcutaneous tissue: Secondary | ICD-10-CM | POA: Diagnosis not present

## 2023-08-03 DIAGNOSIS — L820 Inflamed seborrheic keratosis: Secondary | ICD-10-CM | POA: Diagnosis not present

## 2023-08-03 DIAGNOSIS — L821 Other seborrheic keratosis: Secondary | ICD-10-CM | POA: Diagnosis not present

## 2023-08-03 DIAGNOSIS — L814 Other melanin hyperpigmentation: Secondary | ICD-10-CM | POA: Diagnosis not present

## 2023-08-03 DIAGNOSIS — D229 Melanocytic nevi, unspecified: Secondary | ICD-10-CM

## 2023-08-03 DIAGNOSIS — Z1283 Encounter for screening for malignant neoplasm of skin: Secondary | ICD-10-CM | POA: Diagnosis not present

## 2023-08-03 DIAGNOSIS — L578 Other skin changes due to chronic exposure to nonionizing radiation: Secondary | ICD-10-CM

## 2023-08-03 NOTE — Progress Notes (Signed)
 New Patient Visit   Subjective  Vickie Drake is a 63 y.o. female who presents for the following: Skin Cancer Screening and Full Body Skin Exam Patient denies personal or family history of skin cancer. Reports a spot a nose, spot at left corner of eye she would like checked.   The patient presents for Total-Body Skin Exam (TBSE) for skin cancer screening and mole check. The patient has spots, moles and lesions to be evaluated, some may be new or changing and the patient may have concern these could be cancer.  The following portions of the chart were reviewed this encounter and updated as appropriate: medications, allergies, medical history  Review of Systems:  No other skin or systemic complaints except as noted in HPI or Assessment and Plan.  Objective  Well appearing patient in no apparent distress; mood and affect are within normal limits.  A full examination was performed including scalp, head, eyes, ears, nose, lips, neck, chest, axillae, abdomen, back, buttocks, bilateral upper extremities, bilateral lower extremities, hands, feet, fingers, toes, fingernails, and toenails. All findings within normal limits unless otherwise noted below.   Relevant physical exam findings are noted in the Assessment and Plan.  Left Medial Canthus upper eyelid x 1 Erythematous stuck-on, waxy papule or plaque right and left mid back above braline x 2 (2) Erythematous stuck-on, waxy papule or plaques    Assessment & Plan   SKIN CANCER SCREENING PERFORMED TODAY.  ACTINIC DAMAGE - Chronic condition, secondary to cumulative UV/sun exposure - diffuse scaly erythematous macules with underlying dyspigmentation - Recommend daily broad spectrum sunscreen SPF 30+ to sun-exposed areas, reapply every 2 hours as needed.  - Staying in the shade or wearing long sleeves, sun glasses (UVA+UVB protection) and wide brim hats (4-inch brim around the entire circumference of the hat) are also recommended  for sun protection.  - Call for new or changing lesions.  LENTIGINES, SEBORRHEIC KERATOSES, HEMANGIOMAS - Benign normal skin lesions - Benign-appearing - Call for any changes  MELANOCYTIC NEVI - Tan-brown and/or pink-flesh-colored symmetric macules and papules - Benign appearing on exam today - Observation - Call clinic for new or changing moles - Recommend daily use of broad spectrum spf 30+ sunscreen to sun-exposed areas.   INFLAMED SEBORRHEIC KERATOSIS (3) Left Medial Canthus upper eyelid x 1, right and left mid back above braline x 2 (2) Symptomatic, irritating, patient would like treated. Destruction of lesion - Left Medial Canthus upper eyelid x 1 Complexity: simple   Destruction method: cryotherapy   Informed consent: discussed and consent obtained   Timeout:  patient name, date of birth, surgical site, and procedure verified Lesion destroyed using liquid nitrogen: Yes   Region frozen until ice ball extended beyond lesion: Yes   Outcome: patient tolerated procedure well with no complications   Post-procedure details: wound care instructions given    Destruction of lesion - right and left mid back above braline x 2 (2) Complexity: simple   Destruction method: cryotherapy   Informed consent: discussed and consent obtained   Timeout:  patient name, date of birth, surgical site, and procedure verified Lesion destroyed using liquid nitrogen: Yes   Region frozen until ice ball extended beyond lesion: Yes   Outcome: patient tolerated procedure well with no complications   Post-procedure details: wound care instructions given    Return in about 1 year (around 08/02/2024) for TBSE.  Geralynn Rile, CMA, am acting as scribe for Armida Sans, MD.   Documentation: I have reviewed  the above documentation for accuracy and completeness, and I agree with the above.  Armida Sans, MD

## 2023-08-03 NOTE — Patient Instructions (Addendum)

## 2023-09-16 DIAGNOSIS — R7989 Other specified abnormal findings of blood chemistry: Secondary | ICD-10-CM | POA: Diagnosis not present

## 2023-09-16 DIAGNOSIS — R002 Palpitations: Secondary | ICD-10-CM | POA: Diagnosis not present

## 2023-09-16 DIAGNOSIS — Z87891 Personal history of nicotine dependence: Secondary | ICD-10-CM | POA: Diagnosis not present

## 2023-12-28 DIAGNOSIS — Z0001 Encounter for general adult medical examination with abnormal findings: Secondary | ICD-10-CM | POA: Diagnosis not present

## 2024-01-10 DIAGNOSIS — M1711 Unilateral primary osteoarthritis, right knee: Secondary | ICD-10-CM | POA: Diagnosis not present

## 2024-08-02 ENCOUNTER — Ambulatory Visit: Admitting: Dermatology
